# Patient Record
Sex: Female | Born: 1955
Health system: Southern US, Community
[De-identification: ages and names within clinical notes are randomized; demographics above are authoritative.]

## PROBLEM LIST (undated history)

## (undated) DIAGNOSIS — I10 Essential (primary) hypertension: Secondary | ICD-10-CM

## (undated) DIAGNOSIS — E7800 Pure hypercholesterolemia, unspecified: Secondary | ICD-10-CM

## (undated) DIAGNOSIS — Z90710 Acquired absence of both cervix and uterus: Secondary | ICD-10-CM

## (undated) DIAGNOSIS — M797 Fibromyalgia: Secondary | ICD-10-CM

## (undated) HISTORY — DX: Acquired absence of both cervix and uterus: Z90.710

---

## 1981-04-15 HISTORY — PX: GALLBLADDER SURGERY: SHX652

## 1988-04-15 HISTORY — PX: BILATERAL TEMPOROMANDIBULAR JOINT ARTHROPLASTY: SUR77

## 1989-04-15 DIAGNOSIS — IMO0001 Reserved for inherently not codable concepts without codable children: Secondary | ICD-10-CM | POA: Insufficient documentation

## 1989-04-15 HISTORY — PX: PARTIAL HYSTERECTOMY: SHX80

## 1999-09-14 ENCOUNTER — Encounter: Payer: Self-pay | Admitting: Obstetrics and Gynecology

## 1999-09-14 ENCOUNTER — Encounter: Admission: RE | Admit: 1999-09-14 | Discharge: 1999-09-14 | Payer: Self-pay | Admitting: Obstetrics and Gynecology

## 2001-12-01 ENCOUNTER — Encounter: Payer: Self-pay | Admitting: Family Medicine

## 2001-12-01 ENCOUNTER — Encounter: Admission: RE | Admit: 2001-12-01 | Discharge: 2001-12-01 | Payer: Self-pay | Admitting: Family Medicine

## 2003-01-14 ENCOUNTER — Encounter: Admission: RE | Admit: 2003-01-14 | Discharge: 2003-01-14 | Payer: Self-pay | Admitting: Family Medicine

## 2003-01-14 ENCOUNTER — Encounter: Payer: Self-pay | Admitting: Family Medicine

## 2004-08-29 ENCOUNTER — Encounter: Admission: RE | Admit: 2004-08-29 | Discharge: 2004-08-29 | Payer: Self-pay | Admitting: Obstetrics and Gynecology

## 2005-06-03 ENCOUNTER — Emergency Department (HOSPITAL_COMMUNITY): Admission: EM | Admit: 2005-06-03 | Discharge: 2005-06-03 | Payer: Self-pay | Admitting: Emergency Medicine

## 2005-09-03 ENCOUNTER — Encounter: Admission: RE | Admit: 2005-09-03 | Discharge: 2005-09-03 | Payer: Self-pay | Admitting: Internal Medicine

## 2006-10-15 ENCOUNTER — Encounter: Admission: RE | Admit: 2006-10-15 | Discharge: 2006-10-15 | Payer: Self-pay | Admitting: Obstetrics and Gynecology

## 2007-01-29 ENCOUNTER — Encounter (INDEPENDENT_AMBULATORY_CARE_PROVIDER_SITE_OTHER): Payer: Self-pay | Admitting: Nurse Practitioner

## 2007-01-29 LAB — CONVERTED CEMR LAB
LDL Cholesterol: 150 mg/dL
VLDL: 10 mg/dL

## 2007-10-07 ENCOUNTER — Ambulatory Visit: Payer: Self-pay | Admitting: Nurse Practitioner

## 2007-10-07 DIAGNOSIS — E663 Overweight: Secondary | ICD-10-CM | POA: Insufficient documentation

## 2007-10-07 DIAGNOSIS — L259 Unspecified contact dermatitis, unspecified cause: Secondary | ICD-10-CM | POA: Insufficient documentation

## 2007-10-07 DIAGNOSIS — E7800 Pure hypercholesterolemia, unspecified: Secondary | ICD-10-CM | POA: Insufficient documentation

## 2007-10-07 LAB — CONVERTED CEMR LAB
Alkaline Phosphatase: 56 units/L (ref 39–117)
BUN: 8 mg/dL (ref 6–23)
Basophils Absolute: 0 10*3/uL (ref 0.0–0.1)
Blood in Urine, dipstick: NEGATIVE
CO2: 23 meq/L (ref 19–32)
Chloride: 104 meq/L (ref 96–112)
Cholesterol, target level: 200 mg/dL
Eosinophils Absolute: 0 10*3/uL (ref 0.0–0.7)
Glucose, Bld: 72 mg/dL (ref 70–99)
Glucose, Urine, Semiquant: NEGATIVE
HDL goal, serum: 40 mg/dL
Hemoglobin: 11.2 g/dL — ABNORMAL LOW (ref 12.0–15.0)
Ketones, urine, test strip: NEGATIVE
LDL Cholesterol: 131 mg/dL — ABNORMAL HIGH (ref 0–99)
LDL Goal: 160 mg/dL
Lymphocytes Relative: 40 % (ref 12–46)
Lymphs Abs: 1.4 10*3/uL (ref 0.7–4.0)
MCHC: 30.9 g/dL (ref 30.0–36.0)
MCV: 87 fL (ref 78.0–100.0)
Neutro Abs: 1.9 10*3/uL (ref 1.7–7.7)
Neutrophils Relative %: 54 % (ref 43–77)
Nitrite: NEGATIVE
Platelets: 402 10*3/uL — ABNORMAL HIGH (ref 150–400)
Protein, U semiquant: 30
Sodium: 141 meq/L (ref 135–145)
Specific Gravity, Urine: 1.015
TSH: 1.441 microintl units/mL (ref 0.350–5.50)
Total Protein: 7.1 g/dL (ref 6.0–8.3)
Triglycerides: 61 mg/dL (ref <150)
VLDL: 12 mg/dL (ref 0–40)
WBC Urine, dipstick: NEGATIVE

## 2007-10-08 ENCOUNTER — Encounter (INDEPENDENT_AMBULATORY_CARE_PROVIDER_SITE_OTHER): Payer: Self-pay | Admitting: Nurse Practitioner

## 2007-10-09 ENCOUNTER — Encounter (INDEPENDENT_AMBULATORY_CARE_PROVIDER_SITE_OTHER): Payer: Self-pay | Admitting: Nurse Practitioner

## 2007-10-27 ENCOUNTER — Ambulatory Visit: Payer: Self-pay | Admitting: Nurse Practitioner

## 2007-10-27 DIAGNOSIS — I10 Essential (primary) hypertension: Secondary | ICD-10-CM | POA: Insufficient documentation

## 2007-10-27 DIAGNOSIS — N760 Acute vaginitis: Secondary | ICD-10-CM | POA: Insufficient documentation

## 2007-10-28 ENCOUNTER — Ambulatory Visit: Payer: Self-pay | Admitting: *Deleted

## 2007-10-29 ENCOUNTER — Encounter (INDEPENDENT_AMBULATORY_CARE_PROVIDER_SITE_OTHER): Payer: Self-pay | Admitting: Nurse Practitioner

## 2007-10-30 ENCOUNTER — Encounter (INDEPENDENT_AMBULATORY_CARE_PROVIDER_SITE_OTHER): Payer: Self-pay | Admitting: Nurse Practitioner

## 2007-11-03 ENCOUNTER — Ambulatory Visit (HOSPITAL_COMMUNITY): Admission: RE | Admit: 2007-11-03 | Discharge: 2007-11-03 | Payer: Self-pay | Admitting: Family Medicine

## 2007-11-10 ENCOUNTER — Ambulatory Visit: Payer: Self-pay | Admitting: Family Medicine

## 2007-11-27 ENCOUNTER — Telehealth (INDEPENDENT_AMBULATORY_CARE_PROVIDER_SITE_OTHER): Payer: Self-pay | Admitting: Nurse Practitioner

## 2007-12-10 ENCOUNTER — Ambulatory Visit: Payer: Self-pay | Admitting: Nurse Practitioner

## 2007-12-15 ENCOUNTER — Telehealth (INDEPENDENT_AMBULATORY_CARE_PROVIDER_SITE_OTHER): Payer: Self-pay | Admitting: Nurse Practitioner

## 2008-01-22 ENCOUNTER — Ambulatory Visit: Payer: Self-pay | Admitting: Nurse Practitioner

## 2008-01-22 LAB — CONVERTED CEMR LAB
Cholesterol: 195 mg/dL (ref 0–200)
LDL Cholesterol: 94 mg/dL (ref 0–99)
Triglycerides: 59 mg/dL (ref <150)

## 2008-01-25 ENCOUNTER — Encounter (INDEPENDENT_AMBULATORY_CARE_PROVIDER_SITE_OTHER): Payer: Self-pay | Admitting: Nurse Practitioner

## 2008-11-25 ENCOUNTER — Encounter: Admission: RE | Admit: 2008-11-25 | Discharge: 2008-11-25 | Payer: Self-pay | Admitting: Obstetrics and Gynecology

## 2009-11-30 ENCOUNTER — Encounter: Admission: RE | Admit: 2009-11-30 | Discharge: 2009-11-30 | Payer: Self-pay | Admitting: Obstetrics and Gynecology

## 2010-05-13 LAB — CONVERTED CEMR LAB
Bilirubin Urine: NEGATIVE
Chlamydia, DNA Probe: NEGATIVE
GC Probe Amp, Genital: NEGATIVE
Nitrite: NEGATIVE
Specific Gravity, Urine: 1.02
WBC Urine, dipstick: NEGATIVE

## 2010-10-30 ENCOUNTER — Other Ambulatory Visit: Payer: Self-pay | Admitting: Obstetrics and Gynecology

## 2010-10-30 DIAGNOSIS — Z1231 Encounter for screening mammogram for malignant neoplasm of breast: Secondary | ICD-10-CM

## 2010-12-07 ENCOUNTER — Ambulatory Visit
Admission: RE | Admit: 2010-12-07 | Discharge: 2010-12-07 | Disposition: A | Discharge status: Home / Self Care | Payer: 59 | Source: Ambulatory Visit | Attending: Obstetrics and Gynecology | Admitting: Obstetrics and Gynecology

## 2010-12-07 DIAGNOSIS — Z1231 Encounter for screening mammogram for malignant neoplasm of breast: Secondary | ICD-10-CM

## 2011-12-29 ENCOUNTER — Emergency Department (INDEPENDENT_AMBULATORY_CARE_PROVIDER_SITE_OTHER)
Admission: EM | Admit: 2011-12-29 | Discharge: 2011-12-29 | Disposition: A | Discharge status: Home / Self Care | Payer: Self-pay | Source: Home / Self Care | Attending: Emergency Medicine | Admitting: Emergency Medicine

## 2011-12-29 ENCOUNTER — Encounter (HOSPITAL_COMMUNITY): Payer: Self-pay

## 2011-12-29 DIAGNOSIS — R21 Rash and other nonspecific skin eruption: Secondary | ICD-10-CM

## 2011-12-29 HISTORY — DX: Essential (primary) hypertension: I10

## 2011-12-29 HISTORY — DX: Pure hypercholesterolemia, unspecified: E78.00

## 2011-12-29 MED ORDER — TRIAMCINOLONE ACETONIDE 0.1 % EX CREA: TOPICAL_CREAM | Freq: Two times a day (BID) | CUTANEOUS | Status: AC

## 2011-12-29 MED ORDER — TRIAMCINOLONE ACETONIDE 40 MG/ML IJ SUSP
INTRAMUSCULAR | Status: AC
  Filled 2011-12-29: qty 5

## 2011-12-29 MED ORDER — TRIAMCINOLONE ACETONIDE 40 MG/ML IJ SUSP
60.0000 mg | Freq: Once | INTRAMUSCULAR | Status: AC
  Administered 2011-12-29: 60 mg via INTRAMUSCULAR

## 2011-12-29 NOTE — ED Provider Notes (Signed)
History     CSN: [id number]  Arrival date & time 12/29/11  1057   First MD Initiated Contact with Patient 12/29/11 1147      Chief Complaint  Patient presents with  . Rash    (Consider location/radiation/quality/duration/timing/severity/associated sxs/prior treatment) The history is provided by the patient.  This patient complains of a pruritic rash that began on legs and has since spread to upper body.  Location: lower legs, groin, forearms  Onset: 2 wk ago   Course: worsening Self-treated with: cortaid             Improvement with treatment: improved itch  History Itching: yes  Tenderness: no  New medications/antibiotics: no  Pet exposure: no  Recent travel or tropical exposure: no  New soaps, shampoos, detergent, clothing: no Tick/insect exposure: no   Red Flags Feeling ill: no Fever:no Facial/tongue swelling/difficulty breathing:  no  Diabetic or immunocompromised: no  Past Medical History  Diagnosis Date  . Hypertension   . Hypercholesteremia     History reviewed. No pertinent past surgical history.  History reviewed. No pertinent family history.  History  Substance Use Topics  . Smoking status: Never Smoker   . Smokeless tobacco: Not on file  . Alcohol Use: Yes    OB History    Grav Para Term Preterm Abortions TAB SAB Ect Mult Living                  Review of Systems  Constitutional: Negative.   Respiratory: Negative.   Cardiovascular: Negative.   Skin: Positive for rash. Negative for color change, pallor and wound.    Allergies  Aspirin; Ceftriaxone sodium; Codeine; Doxycycline; Penicillins; Simvastatin; Sulfonamide derivatives; and Trimethoprim  Home Medications   Current Outpatient Rx  Name Route Sig Dispense Refill  . LOSARTAN POTASSIUM-HCTZ 50-12.5 MG PO TABS Oral Take 1 tablet by mouth daily.    Marland Kitchen SIMVASTATIN 40 MG PO TABS Oral Take 40 mg by mouth every evening.    Marland Kitchen VALACYCLOVIR HCL 500 MG PO TABS Oral Take 500 mg by mouth 2  (two) times daily.    . TRIAMCINOLONE ACETONIDE 0.1 % EX CREA Topical Apply topically 2 (two) times daily. 45 g 2    BP 124/79  Pulse 104  Temp 98.9 F (37.2 C) (Oral)  Resp 18  SpO2 100%  Physical Exam  Nursing note and vitals reviewed. Constitutional: She is oriented to person, place, and time. Vital signs are normal. She appears well-developed and well-nourished. She is active and cooperative.  HENT:  Head: Normocephalic.  Mouth/Throat: No oropharyngeal exudate.  Eyes: Conjunctivae normal are normal. Pupils are equal, round, and reactive to light. No scleral icterus.  Neck: Trachea normal. Neck supple.  Cardiovascular: Normal rate, regular rhythm and normal heart sounds.   Pulmonary/Chest: Effort normal and breath sounds normal. No respiratory distress.  Lymphadenopathy:    She has no cervical adenopathy.  Neurological: She is alert and oriented to person, place, and time. No cranial nerve deficit or sensory deficit.  Skin: Skin is warm and dry. Rash noted. Rash is papular.       Fine papular rash noted to bilateral lower extremity, diffuse presentation, upper body same but not as diffuse.  Psychiatric: She has a normal mood and affect. Her speech is normal and behavior is normal. Judgment and thought content normal. Cognition and memory are normal.    ED Course  Procedures (including critical care time)  Labs Reviewed - No data to display No results found.  1. Rash and nonspecific skin eruption       MDM Kenalog 60mg  IM administered in office.  Cool showers; avoid heat, sunlight and anything that makes condition worse.  Continue Zyrtec for at least seven days.  Begin Medrol dosepak tomorrow-follow instructions.  RTC if symptoms do not improve or begin to have problems swallowing, breathing or significant change in condition.          Johnsie Kindred, NP 12/29/11 [phone]

## 2011-12-29 NOTE — ED Provider Notes (Signed)
Medical screening examination/treatment/procedure(s) were performed by non-physician practitioner and as supervising physician I was immediately available for consultation/collaboration.  Raynald Blend, MD 12/29/11 1747

## 2011-12-29 NOTE — ED Notes (Signed)
Pt has itchy rash on lower rt leg for several weeks and yesterday it spread up her legs to her groin and on both arms.  No new meds or products.

## 2012-10-22 ENCOUNTER — Other Ambulatory Visit: Payer: Self-pay

## 2012-10-22 DIAGNOSIS — Z1231 Encounter for screening mammogram for malignant neoplasm of breast: Secondary | ICD-10-CM

## 2012-11-17 ENCOUNTER — Ambulatory Visit
Admission: RE | Admit: 2012-11-17 | Discharge: 2012-11-17 | Disposition: A | Discharge status: Home / Self Care | Payer: BC Managed Care – PPO | Source: Ambulatory Visit

## 2012-11-17 DIAGNOSIS — Z1231 Encounter for screening mammogram for malignant neoplasm of breast: Secondary | ICD-10-CM

## 2013-11-23 ENCOUNTER — Encounter (HOSPITAL_COMMUNITY): Payer: Self-pay | Admitting: Emergency Medicine

## 2013-11-23 DIAGNOSIS — I10 Essential (primary) hypertension: Secondary | ICD-10-CM | POA: Diagnosis not present

## 2013-11-23 DIAGNOSIS — R110 Nausea: Secondary | ICD-10-CM | POA: Insufficient documentation

## 2013-11-23 DIAGNOSIS — K5900 Constipation, unspecified: Secondary | ICD-10-CM | POA: Diagnosis present

## 2013-11-23 LAB — COMPREHENSIVE METABOLIC PANEL
ALT: 11 U/L (ref 0–35)
ANION GAP: 14 (ref 5–15)
AST: 16 U/L (ref 0–37)
Albumin: 4.1 g/dL (ref 3.5–5.2)
Alkaline Phosphatase: 75 U/L (ref 39–117)
BUN: 12 mg/dL (ref 6–23)
CALCIUM: 9.2 mg/dL (ref 8.4–10.5)
CO2: 25 mEq/L (ref 19–32)
Chloride: 101 mEq/L (ref 96–112)
Creatinine, Ser: 0.73 mg/dL (ref 0.50–1.10)
GFR calc non Af Amer: >90 mL/min (ref >90)
Glucose, Bld: 120 mg/dL — ABNORMAL HIGH (ref 70–99)
POTASSIUM: 3.8 meq/L (ref 3.7–5.3)
SODIUM: 140 meq/L (ref 137–147)
TOTAL PROTEIN: 7.8 g/dL (ref 6.0–8.3)
Total Bilirubin: 0.3 mg/dL (ref 0.3–1.2)

## 2013-11-23 LAB — CBC WITH DIFFERENTIAL/PLATELET
BASOS ABS: 0 10*3/uL (ref 0.0–0.1)
Basophils Relative: 0 % (ref 0–1)
Eosinophils Absolute: 0.1 10*3/uL (ref 0.0–0.7)
Eosinophils Relative: 1 % (ref 0–5)
HEMATOCRIT: 35 % — AB (ref 36.0–46.0)
Hemoglobin: 11.3 g/dL — ABNORMAL LOW (ref 12.0–15.0)
LYMPHS PCT: 28 % (ref 12–46)
Lymphs Abs: 1.9 10*3/uL (ref 0.7–4.0)
MCH: 26.5 pg (ref 26.0–34.0)
MCHC: 32.3 g/dL (ref 30.0–36.0)
MCV: 82 fL (ref 78.0–100.0)
Monocytes Absolute: 0.4 10*3/uL (ref 0.1–1.0)
Monocytes Relative: 6 % (ref 3–12)
NEUTROS ABS: 4.4 10*3/uL (ref 1.7–7.7)
NEUTROS PCT: 65 % (ref 43–77)
Platelets: 438 10*3/uL — ABNORMAL HIGH (ref 150–400)
RBC: 4.27 MIL/uL (ref 3.87–5.11)
RDW: 14.2 % (ref 11.5–15.5)
WBC: 6.7 10*3/uL (ref 4.0–10.5)

## 2013-11-23 NOTE — ED Notes (Signed)
Pt. reports constipation/hard stools for 2 weeks with occasional nausea , denies fever , no urinary discomfort.

## 2013-11-24 ENCOUNTER — Emergency Department (HOSPITAL_COMMUNITY)
Admission: EM | Admit: 2013-11-24 | Discharge: 2013-11-24 | Discharge status: Against Medical Advice | Payer: BC Managed Care – PPO | Attending: Emergency Medicine | Admitting: Emergency Medicine

## 2013-11-24 HISTORY — DX: Fibromyalgia: M79.7

## 2013-11-24 NOTE — ED Notes (Signed)
See Paper Charting

## 2014-01-07 ENCOUNTER — Other Ambulatory Visit: Payer: Self-pay

## 2014-01-07 DIAGNOSIS — Z1231 Encounter for screening mammogram for malignant neoplasm of breast: Secondary | ICD-10-CM

## 2014-01-20 ENCOUNTER — Ambulatory Visit
Admission: RE | Admit: 2014-01-20 | Discharge: 2014-01-20 | Disposition: A | Discharge status: Home / Self Care | Payer: BC Managed Care – PPO | Source: Ambulatory Visit

## 2014-01-20 DIAGNOSIS — Z1231 Encounter for screening mammogram for malignant neoplasm of breast: Secondary | ICD-10-CM

## 2014-12-22 ENCOUNTER — Other Ambulatory Visit: Payer: Self-pay

## 2014-12-22 DIAGNOSIS — Z1231 Encounter for screening mammogram for malignant neoplasm of breast: Secondary | ICD-10-CM

## 2015-01-24 ENCOUNTER — Ambulatory Visit
Admission: RE | Admit: 2015-01-24 | Discharge: 2015-01-24 | Disposition: A | Discharge status: Home / Self Care | Payer: 59 | Source: Ambulatory Visit

## 2015-01-24 DIAGNOSIS — Z1231 Encounter for screening mammogram for malignant neoplasm of breast: Secondary | ICD-10-CM

## 2015-12-22 ENCOUNTER — Other Ambulatory Visit: Payer: Self-pay | Admitting: Obstetrics and Gynecology

## 2015-12-22 DIAGNOSIS — Z1231 Encounter for screening mammogram for malignant neoplasm of breast: Secondary | ICD-10-CM

## 2016-01-26 ENCOUNTER — Ambulatory Visit
Admission: RE | Admit: 2016-01-26 | Discharge: 2016-01-26 | Disposition: A | Discharge status: Home / Self Care | Payer: 59 | Source: Ambulatory Visit | Attending: Obstetrics and Gynecology | Admitting: Obstetrics and Gynecology

## 2016-01-26 DIAGNOSIS — Z1231 Encounter for screening mammogram for malignant neoplasm of breast: Secondary | ICD-10-CM

## 2016-04-25 DIAGNOSIS — N3020 Other chronic cystitis without hematuria: Secondary | ICD-10-CM | POA: Diagnosis not present

## 2016-04-25 DIAGNOSIS — N200 Calculus of kidney: Secondary | ICD-10-CM | POA: Diagnosis not present

## 2016-06-24 DIAGNOSIS — I10 Essential (primary) hypertension: Secondary | ICD-10-CM | POA: Diagnosis not present

## 2016-06-24 DIAGNOSIS — M797 Fibromyalgia: Secondary | ICD-10-CM | POA: Diagnosis not present

## 2016-07-18 DIAGNOSIS — R7309 Other abnormal glucose: Secondary | ICD-10-CM | POA: Diagnosis not present

## 2016-07-18 DIAGNOSIS — E785 Hyperlipidemia, unspecified: Secondary | ICD-10-CM | POA: Diagnosis not present

## 2016-07-18 DIAGNOSIS — I10 Essential (primary) hypertension: Secondary | ICD-10-CM | POA: Diagnosis not present

## 2016-08-21 DIAGNOSIS — N200 Calculus of kidney: Secondary | ICD-10-CM | POA: Diagnosis not present

## 2016-10-31 DIAGNOSIS — N200 Calculus of kidney: Secondary | ICD-10-CM | POA: Diagnosis not present

## 2016-10-31 DIAGNOSIS — N3020 Other chronic cystitis without hematuria: Secondary | ICD-10-CM | POA: Diagnosis not present

## 2017-01-14 ENCOUNTER — Other Ambulatory Visit: Payer: Self-pay | Admitting: Obstetrics and Gynecology

## 2017-01-14 DIAGNOSIS — D2371 Other benign neoplasm of skin of right lower limb, including hip: Secondary | ICD-10-CM | POA: Diagnosis not present

## 2017-01-14 DIAGNOSIS — L820 Inflamed seborrheic keratosis: Secondary | ICD-10-CM | POA: Diagnosis not present

## 2017-01-14 DIAGNOSIS — Z1231 Encounter for screening mammogram for malignant neoplasm of breast: Secondary | ICD-10-CM

## 2017-01-14 DIAGNOSIS — L918 Other hypertrophic disorders of the skin: Secondary | ICD-10-CM | POA: Diagnosis not present

## 2017-01-28 ENCOUNTER — Ambulatory Visit
Admission: RE | Admit: 2017-01-28 | Discharge: 2017-01-28 | Disposition: A | Discharge status: Home / Self Care | Payer: 59 | Source: Ambulatory Visit | Attending: Obstetrics and Gynecology | Admitting: Obstetrics and Gynecology

## 2017-01-28 DIAGNOSIS — Z1231 Encounter for screening mammogram for malignant neoplasm of breast: Secondary | ICD-10-CM

## 2017-03-11 DIAGNOSIS — M797 Fibromyalgia: Secondary | ICD-10-CM | POA: Diagnosis not present

## 2017-03-11 DIAGNOSIS — Z23 Encounter for immunization: Secondary | ICD-10-CM | POA: Diagnosis not present

## 2017-03-11 DIAGNOSIS — I10 Essential (primary) hypertension: Secondary | ICD-10-CM | POA: Diagnosis not present

## 2017-03-11 DIAGNOSIS — Z0000 Encounter for general adult medical examination without abnormal findings: Secondary | ICD-10-CM | POA: Diagnosis not present

## 2017-04-14 DIAGNOSIS — M797 Fibromyalgia: Secondary | ICD-10-CM | POA: Diagnosis not present

## 2017-04-14 DIAGNOSIS — I10 Essential (primary) hypertension: Secondary | ICD-10-CM | POA: Diagnosis not present

## 2017-04-23 DIAGNOSIS — N3020 Other chronic cystitis without hematuria: Secondary | ICD-10-CM | POA: Diagnosis not present

## 2017-04-23 DIAGNOSIS — Z87442 Personal history of urinary calculi: Secondary | ICD-10-CM | POA: Diagnosis not present

## 2017-05-17 DIAGNOSIS — R05 Cough: Secondary | ICD-10-CM | POA: Diagnosis not present

## 2017-05-17 DIAGNOSIS — R509 Fever, unspecified: Secondary | ICD-10-CM | POA: Diagnosis not present

## 2017-05-17 DIAGNOSIS — J111 Influenza due to unidentified influenza virus with other respiratory manifestations: Secondary | ICD-10-CM | POA: Diagnosis not present

## 2017-09-02 DIAGNOSIS — R05 Cough: Secondary | ICD-10-CM | POA: Diagnosis not present

## 2017-09-02 DIAGNOSIS — J302 Other seasonal allergic rhinitis: Secondary | ICD-10-CM | POA: Diagnosis not present

## 2017-09-02 DIAGNOSIS — R0982 Postnasal drip: Secondary | ICD-10-CM | POA: Diagnosis not present

## 2017-09-10 DIAGNOSIS — R7309 Other abnormal glucose: Secondary | ICD-10-CM | POA: Diagnosis not present

## 2017-09-10 DIAGNOSIS — I10 Essential (primary) hypertension: Secondary | ICD-10-CM | POA: Diagnosis not present

## 2017-09-10 DIAGNOSIS — J309 Allergic rhinitis, unspecified: Secondary | ICD-10-CM | POA: Diagnosis not present

## 2017-09-10 DIAGNOSIS — E785 Hyperlipidemia, unspecified: Secondary | ICD-10-CM | POA: Diagnosis not present

## 2017-09-10 DIAGNOSIS — Z79899 Other long term (current) drug therapy: Secondary | ICD-10-CM | POA: Diagnosis not present

## 2018-01-06 ENCOUNTER — Other Ambulatory Visit: Payer: Self-pay | Admitting: Internal Medicine

## 2018-01-06 DIAGNOSIS — Z1231 Encounter for screening mammogram for malignant neoplasm of breast: Secondary | ICD-10-CM

## 2018-01-29 ENCOUNTER — Telehealth: Payer: Self-pay

## 2018-01-29 ENCOUNTER — Other Ambulatory Visit: Payer: Self-pay | Admitting: Internal Medicine

## 2018-01-29 NOTE — Telephone Encounter (Signed)
Message sent to Dr. Baird Cancer that the pt is requesting a refill on her Ibuprofen 800 mg tablets

## 2018-02-02 ENCOUNTER — Other Ambulatory Visit: Payer: Self-pay | Admitting: Internal Medicine

## 2018-02-02 NOTE — Telephone Encounter (Signed)
Ibuprofen 800mg refill.

## 2018-02-06 ENCOUNTER — Ambulatory Visit: Payer: 59

## 2018-02-06 ENCOUNTER — Ambulatory Visit
Admission: RE | Admit: 2018-02-06 | Discharge: 2018-02-06 | Disposition: A | Discharge status: Home / Self Care | Payer: 59 | Source: Ambulatory Visit | Attending: Internal Medicine | Admitting: Internal Medicine

## 2018-02-06 DIAGNOSIS — Z1231 Encounter for screening mammogram for malignant neoplasm of breast: Secondary | ICD-10-CM

## 2018-02-09 ENCOUNTER — Ambulatory Visit: Payer: 59

## 2018-02-17 ENCOUNTER — Other Ambulatory Visit: Payer: Self-pay | Admitting: Internal Medicine

## 2018-02-17 ENCOUNTER — Telehealth: Payer: Self-pay

## 2018-02-17 NOTE — Telephone Encounter (Signed)
Called pt and advised her to call her pharmacy and make sure the losartan is currently taking does not have a recall on it and if it does to give [place] a call back so we can switch her to something else. YRL,RMA

## 2018-03-01 ENCOUNTER — Other Ambulatory Visit: Payer: Self-pay | Admitting: Nurse Practitioner

## 2018-03-03 DIAGNOSIS — Z6834 Body mass index (BMI) 34.0-34.9, adult: Secondary | ICD-10-CM | POA: Diagnosis not present

## 2018-03-03 DIAGNOSIS — Z01419 Encounter for gynecological examination (general) (routine) without abnormal findings: Secondary | ICD-10-CM | POA: Diagnosis not present

## 2018-03-09 ENCOUNTER — Other Ambulatory Visit: Payer: Self-pay | Admitting: Internal Medicine

## 2018-03-15 ENCOUNTER — Other Ambulatory Visit: Payer: Self-pay | Admitting: Internal Medicine

## 2018-03-23 ENCOUNTER — Ambulatory Visit: Payer: 59 | Admitting: Nurse Practitioner

## 2018-03-23 ENCOUNTER — Encounter: Payer: Self-pay | Admitting: Nurse Practitioner

## 2018-03-23 VITALS — BP 132/80 | HR 72 | Temp 98.0°F | Ht 62.25 in | Wt 191.6 lb

## 2018-03-23 DIAGNOSIS — Z0000 Encounter for general adult medical examination without abnormal findings: Secondary | ICD-10-CM

## 2018-03-23 DIAGNOSIS — E559 Vitamin D deficiency, unspecified: Secondary | ICD-10-CM | POA: Diagnosis not present

## 2018-03-23 DIAGNOSIS — I10 Essential (primary) hypertension: Secondary | ICD-10-CM

## 2018-03-23 DIAGNOSIS — M797 Fibromyalgia: Secondary | ICD-10-CM

## 2018-03-23 DIAGNOSIS — E785 Hyperlipidemia, unspecified: Secondary | ICD-10-CM | POA: Diagnosis not present

## 2018-03-23 DIAGNOSIS — Z139 Encounter for screening, unspecified: Secondary | ICD-10-CM

## 2018-03-23 DIAGNOSIS — R7303 Prediabetes: Secondary | ICD-10-CM

## 2018-03-23 DIAGNOSIS — Z1211 Encounter for screening for malignant neoplasm of colon: Secondary | ICD-10-CM

## 2018-03-23 LAB — POCT URINALYSIS DIPSTICK
Bilirubin, UA: NEGATIVE
Clarity, UA: NEGATIVE
Color, UA: NEGATIVE
Glucose, UA: NEGATIVE
KETONES UA: NEGATIVE
LEUKOCYTES UA: NEGATIVE
NITRITE UA: NEGATIVE
PH UA: 6.5 (ref 5.0–8.0)
PROTEIN UA: NEGATIVE
RBC UA: NEGATIVE
SPEC GRAV UA: 1.02 (ref 1.010–1.025)
UROBILINOGEN UA: 0.2 U/dL

## 2018-03-23 LAB — POCT UA - MICROALBUMIN
Albumin/Creatinine Ratio, Urine, POC: 30
Creatinine, POC: 200 mg/dL
Microalbumin Ur, POC: 30 mg/L

## 2018-03-23 NOTE — Patient Instructions (Addendum)

## 2018-03-23 NOTE — Progress Notes (Signed)
Subjective:     Patient ID: Margaret Hill , female    DOB: [birth year] , 62 y.o.   MRN: [id number]   Chief Complaint  Patient presents with  . Annual Exam   The patient states she uses post menopausal status for birth control. Last LMP was No LMP recorded. Patient is postmenopausal.. Negative for Dysmenorrhea and Negative for Menorrhagia Mammogram last done 02/06/2018 Negative for: breast discharge, breast lump(s), breast pain and breast self exam.  Pertinent negatives include abnormal bleeding (hematology), anxiety, decreased libido, depression, difficulty falling sleep, dyspareunia, history of infertility, nocturia, sexual dysfunction, sleep disturbances, urinary incontinence, urinary urgency, vaginal discharge and vaginal itching. Diet regular, limits sweets. The patient states her exercise level is  intermittently exercises.    .  The patient's tobacco use is:   Social History   Tobacco Use  Smoking Status Never Smoker  Smokeless Tobacco Never Used  . She has been exposed to passive smoke. The patient's alcohol use is:   Social History   Substance and Sexual Activity  Alcohol Use Yes  . Additional information: Last pap 02/2018 normal , next one scheduled for 2-3 years   HPI  Here for HM  Obesity - she is exercising 3-4 days per week  Hypertension  This is a chronic problem. The current episode started more than 1 year ago. The problem is unchanged. The problem is controlled. Pertinent negatives include no anxiety or palpitations. There are no associated agents to hypertension. Risk factors for coronary artery disease include sedentary lifestyle. The current treatment provides no improvement. There are no compliance problems.  There is no history of kidney disease. There is no history of chronic renal disease.     Past Medical History:  Diagnosis Date  . Fibromyalgia   . Hypercholesteremia   . Hypertension      No family history on file.   Current Outpatient  Medications:  .  Famotidine 20 MG CHEW, Chew 10 mg by mouth daily., Disp: , Rfl:  .  fluocinonide ointment (LIDEX) 2.77 %, Apply 1 application topically 2 (two) times daily., Disp: , Rfl:  .  LIVALO 4 MG TABS, TAKE 1 TABLET BY MOUTH EVERY DAY, Disp: 30 tablet, Rfl: 0 .  losartan-hydrochlorothiazide (HYZAAR) 50-12.5 MG tablet, TAKE 1 TABLET BY MOUTH EVERY DAY, Disp: 30 tablet, Rfl: 0 .  valACYclovir (VALTREX) 500 MG tablet, Take 500 mg by mouth 2 (two) times daily., Disp: , Rfl:    Allergies  Allergen Reactions  . Aspirin     REACTION: stomach pain  . Ceftriaxone Sodium   . Codeine     REACTION: shortness of breath  . Doxycycline     REACTION: hives/skin rash  . Penicillins     REACTION: hives/skin rash  . Simvastatin     REACTION: muscular pain  . Sulfonamide Derivatives     REACTION: hives/skin rash  . Trimethoprim     REACTION: Hives/skin rash     Review of Systems  Constitutional: Negative.   HENT: Negative.   Eyes: Negative.   Respiratory: Negative.   Cardiovascular: Negative.  Negative for palpitations.  Gastrointestinal: Negative.   Endocrine: Negative.   Genitourinary: Negative.   Musculoskeletal: Negative.   Skin: Negative.   Allergic/Immunologic: Negative.   Neurological: Negative.   Hematological: Negative.   Psychiatric/Behavioral: Negative.      Today's Vitals   03/23/18 1116  BP: 132/80  Pulse: 72  Temp: 98 F (36.7 C)  TempSrc: Oral  SpO2: 98%  Weight: 191  lb 9.6 oz (86.9 kg)  Height: 5' 2.25" (1.581 m)   Body mass index is 34.76 kg/m.   Objective:  Physical Exam  Constitutional: She is oriented to person, place, and time. Vital signs are normal. She appears well-developed and well-nourished.  HENT:  Head: Normocephalic and atraumatic.  Right Ear: Hearing, tympanic membrane, external ear and ear canal normal.  Left Ear: Hearing, tympanic membrane, external ear and ear canal normal.  Nose: Nose normal.  Mouth/Throat: Oropharynx is clear  and moist.  Eyes: Pupils are equal, round, and reactive to light. Conjunctivae, EOM and lids are normal.  Fundoscopic exam:      The right eye shows no papilledema.       The left eye shows no papilledema.  Neck: Normal range of motion and full passive range of motion without pain. Neck supple. Carotid bruit is not present. No thyroid mass present.  Cardiovascular: Normal rate, regular rhythm, normal heart sounds, intact distal pulses and normal pulses.  No murmur heard. Pulmonary/Chest: Effort normal and breath sounds normal.  Abdominal: Soft. Bowel sounds are normal. There is tenderness (slightly tender to left abdomen).  Musculoskeletal: Normal range of motion.  Neurological: She is alert and oriented to person, place, and time. She has normal strength. No cranial nerve deficit or sensory deficit.  Skin: Skin is warm and dry. Capillary refill takes less than 2 seconds.  Psychiatric: She has a normal mood and affect. Her behavior is normal. Judgment and thought content normal.        Assessment And Plan:     1. Health maintenance examination . Behavior modifications discussed and diet history reviewed.   . Pt will continue to exercise regularly and modify diet with low GI, plant based foods and decrease intake of processed foods.  . Recommend intake of daily multivitamin, Vitamin D, and calcium.  . Recommend mammogram (done) and colonoscopy for preventive screenings, as well as recommend immunizations that include influenza, and Shingles  2. HYPERTENSION, BENIGN ESSENTIAL  Chronic, controlled  Continue with current medications - POCT Urinalysis Dipstick (81002) - POCT UA - Microalbumin - EKG 12-Lead  3. Vitamin D deficiency  Will check vitamin d level  Encouraged to continue with vitamin d supplement - Vitamin D (25 hydroxy)  4. Hyperlipidemia, unspecified hyperlipidemia type  Chronic, controlled  Continue with current medications - CMP14 + Anion Gap - Lipid  panel  5. Prediabetes  Chronic, controlled  No current medications - CMP14 + Anion Gap - CBC - Hemoglobin A1c  6. Encounter for screening colonoscopy  According to USPTF Colorectal cancer Screening guidelines. Colonoscopy is recommended every 10 years, starting at age 62years.  Will refer to GI for colon cancer screening. - Ambulatory referral to Gastroenterology  7. Fibromyalgia  Chronic  Currently controlled       [name], FNP  Patient ID: Margaret Hill, female   DOB: April 08, 1956, 62 y.o.   MRN: [id number]

## 2018-03-24 LAB — CMP14 + ANION GAP
ALT: 12 IU/L (ref 0–32)
AST: 18 IU/L (ref 0–40)
Albumin/Globulin Ratio: 1.7 (ref 1.2–2.2)
Albumin: 4.9 g/dL — ABNORMAL HIGH (ref 3.6–4.8)
Alkaline Phosphatase: 68 IU/L (ref 39–117)
Anion Gap: 26 mmol/L — ABNORMAL HIGH (ref 10.0–18.0)
BUN/Creatinine Ratio: 14 (ref 12–28)
BUN: 11 mg/dL (ref 8–27)
Bilirubin Total: 0.6 mg/dL (ref 0.0–1.2)
CHLORIDE: 96 mmol/L (ref 96–106)
CO2: 20 mmol/L (ref 20–29)
Calcium: 9.3 mg/dL (ref 8.7–10.3)
Creatinine, Ser: 0.76 mg/dL (ref 0.57–1.00)
GFR calc Af Amer: 97 mL/min/{1.73_m2} (ref >59)
GFR calc non Af Amer: 84 mL/min/{1.73_m2} (ref >59)
Globulin, Total: 2.9 g/dL (ref 1.5–4.5)
Glucose: 74 mg/dL (ref 65–99)
POTASSIUM: 4.2 mmol/L (ref 3.5–5.2)
Sodium: 142 mmol/L (ref 134–144)
Total Protein: 7.8 g/dL (ref 6.0–8.5)

## 2018-03-24 LAB — CBC
Hematocrit: 35.9 % (ref 34.0–46.6)
Hemoglobin: 11.4 g/dL (ref 11.1–15.9)
MCH: 26.3 pg — ABNORMAL LOW (ref 26.6–33.0)
MCHC: 31.8 g/dL (ref 31.5–35.7)
MCV: 83 fL (ref 79–97)
Platelets: 476 10*3/uL — ABNORMAL HIGH (ref 150–450)
RBC: 4.34 x10E6/uL (ref 3.77–5.28)
RDW: 13.1 % (ref 12.3–15.4)
WBC: 5.6 10*3/uL (ref 3.4–10.8)

## 2018-03-24 LAB — LIPID PANEL
CHOLESTEROL TOTAL: 178 mg/dL (ref 100–199)
Chol/HDL Ratio: 2.4 ratio (ref 0.0–4.4)
HDL: 73 mg/dL (ref >39)
LDL Calculated: 95 mg/dL (ref 0–99)
TRIGLYCERIDES: 52 mg/dL (ref 0–149)
VLDL Cholesterol Cal: 10 mg/dL (ref 5–40)

## 2018-03-24 LAB — HEMOGLOBIN A1C
Est. average glucose Bld gHb Est-mCnc: 134 mg/dL
Hgb A1c MFr Bld: 6.3 % — ABNORMAL HIGH (ref 4.8–5.6)

## 2018-03-24 LAB — VITAMIN D 25 HYDROXY (VIT D DEFICIENCY, FRACTURES): Vit D, 25-Hydroxy: 25 ng/mL — ABNORMAL LOW (ref 30.0–100.0)

## 2018-03-24 LAB — HEPATITIS C ANTIBODY: Hep C Virus Ab: <0.1 s/co ratio (ref 0.0–0.9)

## 2018-04-20 ENCOUNTER — Other Ambulatory Visit: Payer: Self-pay | Admitting: Internal Medicine

## 2018-04-28 ENCOUNTER — Encounter: Payer: Self-pay | Admitting: Internal Medicine

## 2018-04-28 ENCOUNTER — Ambulatory Visit: Payer: 59 | Admitting: Internal Medicine

## 2018-04-28 VITALS — BP 120/78 | HR 80 | Temp 98.0°F | Ht 62.6 in | Wt 192.2 lb

## 2018-04-28 DIAGNOSIS — M797 Fibromyalgia: Secondary | ICD-10-CM

## 2018-04-28 MED ORDER — KETOROLAC TROMETHAMINE 60 MG/2ML IM SOLN
60.0000 mg | Freq: Once | INTRAMUSCULAR | Status: AC
  Administered 2018-04-28: 60 mg via INTRAMUSCULAR

## 2018-04-28 NOTE — Patient Instructions (Signed)
TRY MELATONIN FOR SLEEP 3-10 MG QT BED TIME.

## 2018-04-28 NOTE — Progress Notes (Signed)
Subjective:     Patient ID: Margaret Hill , female    DOB: 15-Apr-1956 , 63 y.o.   MRN: [id number]   Chief Complaint  Patient presents with  . fibro f/u    patient states her whole body is in pain she stated it is due to stress    HPI  She is here due to fibro flair 05-01-18 and she tripped over a suit case and since then her pain has been worse. She denies injuring herself. She has not been exercising since. She does do her stretches qd. Her sister died 05/01/18 and when she gets this way Toradol helps her.    Past Medical History:  Diagnosis Date  . Fibromyalgia   . Hypercholesteremia   . Hypertension      No family history on file.   Current Outpatient Medications:  .  Famotidine 20 MG CHEW, Chew 10 mg by mouth daily., Disp: , Rfl:  .  fluocinonide ointment (LIDEX) 0.86 %, Apply 1 application topically 2 (two) times daily., Disp: , Rfl:  .  LIVALO 4 MG TABS, TAKE 1 TABLET BY MOUTH EVERY DAY, Disp: 30 tablet, Rfl: 0 .  losartan-hydrochlorothiazide (HYZAAR) 50-12.5 MG tablet, TAKE 1 TABLET BY MOUTH EVERY DAY, Disp: 30 tablet, Rfl: 0 .  valACYclovir (VALTREX) 500 MG tablet, Take 500 mg by mouth 2 (two) times daily., Disp: , Rfl:    Allergies  Allergen Reactions  . Aspirin     REACTION: stomach pain  . Ceftriaxone Sodium   . Codeine     REACTION: shortness of breath  . Doxycycline     REACTION: hives/skin rash  . Penicillins     REACTION: hives/skin rash  . Simvastatin     REACTION: muscular pain  . Sulfonamide Derivatives     REACTION: hives/skin rash  . Trimethoprim     REACTION: Hives/skin rash     Review of Systems  Constitutional: Negative for appetite change.  Psychiatric/Behavioral: Positive for sleep disturbance.       Only gets 3 h per night.      Today's Vitals   04/28/18 1430  BP: 120/78  Pulse: 80  Temp: 98 F (36.7 C)  TempSrc: Oral  SpO2: 96%  Weight: 192 lb 3.2 oz (87.2 kg)  Height: 5' 2.6" (1.59 m)  PainSc: 10-Worst pain ever   Body  mass index is 34.48 kg/m.   Objective:  Physical Exam Vitals signs and nursing note reviewed.  Constitutional:      General: She is not in acute distress.    Appearance: She is obese. She is not toxic-appearing.  HENT:     Head: Normocephalic.     Right Ear: External ear normal.     Left Ear: External ear normal.     Nose: Nose normal.  Eyes:     General: No scleral icterus.    Conjunctiva/sclera: Conjunctivae normal.  Neck:     Musculoskeletal: Neck supple.  Cardiovascular:     Rate and Rhythm: Normal rate and regular rhythm.     Heart sounds: No murmur.  Pulmonary:     Effort: Pulmonary effort is normal.     Breath sounds: Normal breath sounds.  Musculoskeletal: Normal range of motion.     Comments: Has 6 point tender areas most on R side of body  Skin:    General: Skin is warm and dry.  Neurological:     Mental Status: She is alert and oriented to person, place, and time.  Psychiatric:  Behavior: Behavior normal.        Thought Content: Thought content normal.        Judgment: Judgment normal.     Comments: MOOD IS SAD     Assessment And Plan:   1. Fibromyalgia- acute flair.  - ketorolac (TORADOL) injection 60 mg  Fu prn.     Marshella Tello RODRIGUEZ-SOUTHWORTH, PA-C

## 2018-05-17 ENCOUNTER — Other Ambulatory Visit: Payer: Self-pay | Admitting: Internal Medicine

## 2018-06-23 ENCOUNTER — Other Ambulatory Visit: Payer: Self-pay | Admitting: Nurse Practitioner

## 2018-07-21 ENCOUNTER — Other Ambulatory Visit: Payer: Self-pay | Admitting: Nurse Practitioner

## 2018-09-21 ENCOUNTER — Other Ambulatory Visit: Payer: Self-pay

## 2018-09-21 ENCOUNTER — Ambulatory Visit: Payer: 59 | Admitting: Nurse Practitioner

## 2018-09-21 ENCOUNTER — Encounter: Payer: Self-pay | Admitting: Nurse Practitioner

## 2018-09-21 VITALS — BP 124/84 | HR 79 | Temp 98.1°F | Wt 196.4 lb

## 2018-09-21 DIAGNOSIS — R7303 Prediabetes: Secondary | ICD-10-CM | POA: Diagnosis not present

## 2018-09-21 DIAGNOSIS — E785 Hyperlipidemia, unspecified: Secondary | ICD-10-CM

## 2018-09-21 DIAGNOSIS — I10 Essential (primary) hypertension: Secondary | ICD-10-CM | POA: Diagnosis not present

## 2018-09-21 DIAGNOSIS — R110 Nausea: Secondary | ICD-10-CM | POA: Insufficient documentation

## 2018-09-21 DIAGNOSIS — R519 Headache, unspecified: Secondary | ICD-10-CM | POA: Insufficient documentation

## 2018-09-21 DIAGNOSIS — M797 Fibromyalgia: Secondary | ICD-10-CM

## 2018-09-21 DIAGNOSIS — E559 Vitamin D deficiency, unspecified: Secondary | ICD-10-CM

## 2018-09-21 DIAGNOSIS — M7660 Achilles tendinitis, unspecified leg: Secondary | ICD-10-CM | POA: Insufficient documentation

## 2018-09-21 DIAGNOSIS — R51 Headache: Secondary | ICD-10-CM

## 2018-09-21 MED ORDER — PITAVASTATIN CALCIUM 4 MG PO TABS: 1.0000 | ORAL_TABLET | Freq: Every day | ORAL | 1 refills | Status: DC

## 2018-09-21 MED ORDER — LOSARTAN POTASSIUM-HCTZ 50-12.5 MG PO TABS: 1.0000 | ORAL_TABLET | Freq: Every day | ORAL | 1 refills | Status: DC

## 2018-09-21 MED ORDER — PREDNISONE 10 MG (21) PO TBPK: ORAL_TABLET | ORAL | 0 refills | Status: DC

## 2018-09-21 NOTE — Progress Notes (Signed)
Subjective:     Patient ID: Margaret Hill , female    DOB: [birth year] , 63 y.o.   MRN: [id number]   Chief Complaint  Patient presents with  . Hypertension    HPI  In the last 2 months she has dealt with multiple deaths.    Hypertension  This is a chronic problem. The current episode started more than 1 year ago. The problem is unchanged. The problem is controlled. Associated symptoms include headaches. Pertinent negatives include no anxiety or palpitations. There are no associated agents to hypertension. Risk factors for coronary artery disease include sedentary lifestyle. Past treatments include diuretics and ACE inhibitors. The current treatment provides no improvement. There are no compliance problems.  There is no history of kidney disease. There is no history of chronic renal disease.  Ankle Pain   Injury mechanism: injured as a child. The pain is present in the right ankle. The quality of the pain is described as aching. The pain is at a severity of 5/10. The patient is experiencing no pain. The pain has been intermittent since onset. Pertinent negatives include no inability to bear weight or muscle weakness. She has tried ice and NSAIDs for the symptoms. The treatment provided no relief.  Headache   This is a new problem. The current episode started in the past 7 days. The problem occurs daily. The problem has been gradually worsening. The pain does not radiate. The quality of the pain is described as aching. Pertinent negatives include no sinus pressure, sore throat or visual change. She has tried acetaminophen for the symptoms. Her past medical history is significant for hypertension and migraine headaches (as a teenager).     Past Medical History:  Diagnosis Date  . Fibromyalgia   . Hypercholesteremia   . Hypertension      No family history on file.   Current Outpatient Medications:  .  Famotidine 20 MG CHEW, Chew 10 mg by mouth daily., Disp: , Rfl:  .  fluocinonide  ointment (LIDEX) 3.79 %, Apply 1 application topically 2 (two) times daily., Disp: , Rfl:  .  LIVALO 4 MG TABS, TAKE 1 TABLET BY MOUTH EVERY DAY, Disp: 30 tablet, Rfl: 0 .  losartan-hydrochlorothiazide (HYZAAR) 50-12.5 MG tablet, TAKE 1 TABLET BY MOUTH EVERY DAY, Disp: 90 tablet, Rfl: 0 .  valACYclovir (VALTREX) 500 MG tablet, Take 500 mg by mouth 2 (two) times daily., Disp: , Rfl:    Allergies  Allergen Reactions  . Aspirin     REACTION: stomach pain  . Ceftriaxone Sodium   . Codeine     REACTION: shortness of breath  . Doxycycline     REACTION: hives/skin rash  . Penicillins     REACTION: hives/skin rash  . Simvastatin     REACTION: muscular pain  . Sulfonamide Derivatives     REACTION: hives/skin rash  . Trimethoprim     REACTION: Hives/skin rash     Review of Systems  Constitutional: Negative.   HENT: Negative.  Negative for sinus pressure and sore throat.   Eyes: Negative.   Respiratory: Negative.   Cardiovascular: Negative.  Negative for palpitations.  Gastrointestinal: Negative.   Endocrine: Negative.   Genitourinary: Negative for frequency.  Musculoskeletal: Negative.   Skin: Negative.   Allergic/Immunologic: Negative.   Neurological: Positive for headaches.  Hematological: Negative.   Psychiatric/Behavioral: Negative.      Today's Vitals   09/21/18 0846  BP: 124/84  Pulse: 79  Temp: 98.1 F (36.7 C)  TempSrc:  Oral  Weight: 196 lb 6.4 oz (89.1 kg)  PainSc: 8    Body mass index is 35.24 kg/m.   Objective:  Physical Exam Constitutional:      Appearance: Normal appearance. She is well-developed. She is obese.  HENT:     Head:     Comments: Left temporal area with tenderness, negative for ropelike vessels.    Right Ear: Hearing normal.     Left Ear: Hearing normal.  Eyes:     General: Lids are normal.     Funduscopic exam:    Right eye: No papilledema.        Left eye: No papilledema.  Neck:     Musculoskeletal: Full passive range of motion  without pain, normal range of motion and neck supple.     Thyroid: No thyroid mass.     Vascular: No carotid bruit.  Cardiovascular:     Rate and Rhythm: Normal rate and regular rhythm.     Pulses: Normal pulses.     Heart sounds: Normal heart sounds. No murmur.  Pulmonary:     Effort: Pulmonary effort is normal.     Breath sounds: Normal breath sounds.  Abdominal:     Tenderness: There is no abdominal tenderness.  Musculoskeletal:        General: Tenderness (right medial ankle and achilles) present. No swelling or deformity.     Right lower leg: No edema.     Left lower leg: No edema.  Skin:    General: Skin is warm and dry.     Capillary Refill: Capillary refill takes less than 2 seconds.  Neurological:     General: No focal deficit present.     Mental Status: She is alert and oriented to person, place, and time.     Cranial Nerves: No cranial nerve deficit.     Sensory: No sensory deficit.  Psychiatric:        Mood and Affect: Mood normal.        Behavior: Behavior normal.        Thought Content: Thought content normal.        Judgment: Judgment normal.         Assessment And Plan:   1. HYPERTENSION, BENIGN ESSENTIAL . B/P is controlled.  . CMP ordered to check renal function.  . The importance of regular exercise and dietary modification was stressed to the patient.  . Stressed importance of losing ten percent of her body weight to help with B/P control.  - CMP14 + Anion Gap  2. Vitamin D deficiency  Will check vitamin D level and supplement as needed.     Also encouraged to spend 15 minutes in the sun daily.   3. Hyperlipidemia, unspecified hyperlipidemia type  Chronic, controlled  Continue with current medications  4. Prediabetes  Chronic, controlled  Encouraged to increase her fiber intake  Encouraged to limit intake of sugary foods and drinks  Encouraged to increase physical activity to 150 minutes per week as tolerated in small increments -  Hemoglobin A1c  5. Fibromyalgia  Chronic, slight flair she feels pain to the right side of her abdomen  6. Achilles tendon pain  Tenderness to medial aspect of achilles tendon.    Will treat with prednisone  Will also send for achilles xray to evaluate for structural damage. - predniSONE (STERAPRED UNI-PAK 21 TAB) 10 MG (21) TBPK tablet; Take as directed  Dispense: 21 tablet; Refill: 0 - DG Ankle Complete Right; Future  7. Acute  nonintractable headache, unspecified headache type  Tenderness to left temporal area.  She is over the age of 63 so will check for elevated ESR, she will be on prednisone already for her tendon for possible tendonitis - Sed Rate (ESR)  8. Nausea  May be related to possible headache  If symptoms worsen she is to go to ER or return to call to office - Sed Rate (ESR)     [name], FNP    THE PATIENT IS ENCOURAGED TO PRACTICE SOCIAL DISTANCING DUE TO THE COVID-19 PANDEMIC.

## 2018-09-22 ENCOUNTER — Ambulatory Visit
Admission: RE | Admit: 2018-09-22 | Discharge: 2018-09-22 | Disposition: A | Discharge status: Home / Self Care | Payer: 59 | Source: Ambulatory Visit | Attending: Nurse Practitioner | Admitting: Nurse Practitioner

## 2018-09-22 DIAGNOSIS — M7660 Achilles tendinitis, unspecified leg: Secondary | ICD-10-CM

## 2018-09-22 LAB — CMP14 + ANION GAP
ALT: 13 IU/L (ref 0–32)
AST: 17 IU/L (ref 0–40)
Albumin/Globulin Ratio: 2 (ref 1.2–2.2)
Albumin: 4.8 g/dL (ref 3.8–4.8)
Alkaline Phosphatase: 62 IU/L (ref 39–117)
Anion Gap: 14 mmol/L (ref 10.0–18.0)
BUN/Creatinine Ratio: 21 (ref 12–28)
BUN: 16 mg/dL (ref 8–27)
Bilirubin Total: 0.5 mg/dL (ref 0.0–1.2)
CO2: 24 mmol/L (ref 20–29)
Calcium: 9.4 mg/dL (ref 8.7–10.3)
Chloride: 102 mmol/L (ref 96–106)
Creatinine, Ser: 0.75 mg/dL (ref 0.57–1.00)
GFR calc Af Amer: 99 mL/min/{1.73_m2} (ref >59)
GFR calc non Af Amer: 86 mL/min/{1.73_m2} (ref >59)
Globulin, Total: 2.4 g/dL (ref 1.5–4.5)
Glucose: 91 mg/dL (ref 65–99)
Potassium: 4.3 mmol/L (ref 3.5–5.2)
Sodium: 140 mmol/L (ref 134–144)
Total Protein: 7.2 g/dL (ref 6.0–8.5)

## 2018-09-22 LAB — HEMOGLOBIN A1C
Est. average glucose Bld gHb Est-mCnc: 137 mg/dL
Hgb A1c MFr Bld: 6.4 % — ABNORMAL HIGH (ref 4.8–5.6)

## 2018-09-22 LAB — SEDIMENTATION RATE: Sed Rate: 33 mm/hr (ref 0–40)

## 2018-09-22 NOTE — Progress Notes (Signed)
Okay noted

## 2018-11-13 ENCOUNTER — Ambulatory Visit (HOSPITAL_COMMUNITY)
Admission: EM | Admit: 2018-11-13 | Discharge: 2018-11-13 | Disposition: A | Discharge status: Home / Self Care | Payer: 59 | Attending: Internal Medicine | Admitting: Internal Medicine

## 2018-11-13 ENCOUNTER — Other Ambulatory Visit: Payer: Self-pay

## 2018-11-13 ENCOUNTER — Encounter (HOSPITAL_COMMUNITY): Payer: Self-pay

## 2018-11-13 DIAGNOSIS — Z20828 Contact with and (suspected) exposure to other viral communicable diseases: Secondary | ICD-10-CM | POA: Diagnosis present

## 2018-11-13 DIAGNOSIS — Z20822 Contact with and (suspected) exposure to covid-19: Secondary | ICD-10-CM

## 2018-11-13 NOTE — Discharge Instructions (Addendum)
We will manage this as a viral syndrome. For sore throat or cough try using a honey-based tea. Use 3 teaspoons of honey with juice squeezed from half lemon. Place shaved pieces of ginger into 1/2-1 cup of water and warm over stove top. Then mix the ingredients and repeat every 4 hours as needed. Please take Tylenol 500mg every 6 hours. Hydrate very well with at least 2 liters of water. Eat light meals such as soups to replenish electrolytes and soft fruits, veggies. Start an antihistamine like Zyrtec, Allegra or Claritin for postnasal drainage, sinus congestion.  

## 2018-11-13 NOTE — ED Triage Notes (Signed)
Pt presents for Covid Testing after family member tested positive.

## 2018-11-13 NOTE — ED Provider Notes (Signed)
MRN: [id number] DOB: April 06, 1956  Subjective:   Margaret Hill is a 63 y.o. female presenting for COVID-19 testing.  Patient states that she was taking care of her granddaughter, she recently became ill and tested positive for COVID-19.  She also takes care of her grandson who is not yet manifesting symptoms.  Patient reports that she is doing well and wants to do this as a precaution.   No current facility-administered medications for this encounter.   Current Outpatient Medications:  .  Famotidine 20 MG CHEW, Chew 10 mg by mouth daily., Disp: , Rfl:  .  fluocinonide ointment (LIDEX) 6.94 %, Apply 1 application topically 2 (two) times daily., Disp: , Rfl:  .  losartan-hydrochlorothiazide (HYZAAR) 50-12.5 MG tablet, Take 1 tablet by mouth daily., Disp: 90 tablet, Rfl: 1 .  Pitavastatin Calcium (LIVALO) 4 MG TABS, Take 1 tablet (4 mg total) by mouth daily., Disp: 90 tablet, Rfl: 1 .  predniSONE (STERAPRED UNI-PAK 21 TAB) 10 MG (21) TBPK tablet, Take as directed, Disp: 21 tablet, Rfl: 0 .  valACYclovir (VALTREX) 500 MG tablet, Take 500 mg by mouth 2 (two) times daily., Disp: , Rfl:     Allergies  Allergen Reactions  . Aspirin     REACTION: stomach pain  . Ceftriaxone Sodium   . Codeine     REACTION: shortness of breath  . Doxycycline     REACTION: hives/skin rash  . Penicillins     REACTION: hives/skin rash  . Simvastatin     REACTION: muscular pain  . Sulfonamide Derivatives     REACTION: hives/skin rash  . Trimethoprim     REACTION: Hives/skin rash    Past Medical History:  Diagnosis Date  . Fibromyalgia   . Hypercholesteremia   . Hypertension      History reviewed. No pertinent surgical history.  Review of Systems  Constitutional: Negative for fever and malaise/fatigue.  HENT: Negative for congestion, ear pain, sinus pain and sore throat.   Eyes: Negative for blurred vision, double vision, discharge and redness.  Respiratory: Negative for cough, hemoptysis,  shortness of breath and wheezing.   Cardiovascular: Negative for chest pain.  Gastrointestinal: Negative for abdominal pain, diarrhea, nausea and vomiting.  Genitourinary: Negative for dysuria, flank pain and hematuria.  Musculoskeletal: Negative for myalgias.  Skin: Negative for rash.  Neurological: Negative for dizziness, weakness and headaches.  Psychiatric/Behavioral: Negative for depression and substance abuse.    Objective:   Vitals: BP 139/82 (BP Location: Left Arm)   Pulse 94   Temp 99 F (37.2 C) (Oral)   Resp 18   SpO2 100%   Physical Exam Constitutional:      General: She is not in acute distress.    Appearance: Normal appearance. She is well-developed. She is not ill-appearing, toxic-appearing or diaphoretic.  HENT:     Head: Normocephalic and atraumatic.     Nose: Nose normal.     Mouth/Throat:     Mouth: Mucous membranes are moist.  Eyes:     Extraocular Movements: Extraocular movements intact.     Pupils: Pupils are equal, round, and reactive to light.  Cardiovascular:     Rate and Rhythm: Normal rate and regular rhythm.     Pulses: Normal pulses.     Heart sounds: Normal heart sounds. No murmur. No friction rub. No gallop.   Pulmonary:     Effort: Pulmonary effort is normal. No respiratory distress.     Breath sounds: Normal breath sounds. No stridor. No wheezing,  rhonchi or rales.  Skin:    General: Skin is warm and dry.     Findings: No rash.  Neurological:     Mental Status: She is alert and oriented to person, place, and time.  Psychiatric:        Mood and Affect: Mood normal.        Behavior: Behavior normal.        Thought Content: Thought content normal.     Assessment and Plan :   1. Close Exposure to Covid-19 Virus     Likely viral in etiology. Counseled patient on nature of COVID-19 including modes of transmission, diagnostic testing, management and supportive care.  Offered symptomatic relief. COVID 19 testing is pending. Counseled  patient on potential for adverse effects with medications prescribed/recommended today, ER and return-to-clinic precautions discussed, patient verbalized understanding.     Jaynee Eagles, [place] 11/13/18 1436

## 2018-11-15 LAB — NOVEL CORONAVIRUS, NAA (HOSP ORDER, SEND-OUT TO REF LAB; TAT 18-24 HRS): SARS-CoV-2, NAA: NOT DETECTED

## 2018-11-16 ENCOUNTER — Encounter (HOSPITAL_COMMUNITY): Payer: Self-pay

## 2018-12-14 ENCOUNTER — Ambulatory Visit: Payer: 59 | Admitting: Nurse Practitioner

## 2018-12-14 ENCOUNTER — Encounter: Payer: Self-pay | Admitting: Nurse Practitioner

## 2018-12-14 ENCOUNTER — Other Ambulatory Visit: Payer: Self-pay

## 2018-12-14 VITALS — BP 160/90 | HR 89 | Temp 98.4°F | Ht 63.6 in | Wt 190.6 lb

## 2018-12-14 DIAGNOSIS — R030 Elevated blood-pressure reading, without diagnosis of hypertension: Secondary | ICD-10-CM

## 2018-12-14 DIAGNOSIS — M797 Fibromyalgia: Secondary | ICD-10-CM

## 2018-12-14 MED ORDER — KETOROLAC TROMETHAMINE 60 MG/2ML IM SOLN
60.0000 mg | Freq: Once | INTRAMUSCULAR | Status: AC
  Administered 2018-12-14: 60 mg via INTRAMUSCULAR

## 2018-12-14 MED ORDER — TRIAMCINOLONE ACETONIDE 40 MG/ML IJ SUSP
40.0000 mg | Freq: Once | INTRAMUSCULAR | Status: AC
  Administered 2018-12-14: 40 mg via INTRAMUSCULAR

## 2018-12-14 NOTE — Progress Notes (Signed)
Subjective:     Patient ID: Margaret Hill , female    DOB: [birth year] , 63 y.o.   MRN: [id number]   Chief Complaint  Patient presents with  . Pain    generalized- across the shoulders and down right side    HPI  She is here today for generalized pain all over due to the humidity and weather changes for the last week. She has been trying to exercise. She is having pain greater than 10, she is having pain with when her clothes touch her skin.  Pain worse on left shoulder, in addition to ankle, knee and wrist.     She feels it is related to her fibromyalgia.  When she has these flares.    Wt Readings from Last 3 Encounters: 12/14/18 : 190 lb 9.6 oz (86.5 kg) 09/21/18 : 196 lb 6.4 oz (89.1 kg) 04/28/18 : 192 lb 3.2 oz (87.2 kg)     Past Medical History:  Diagnosis Date  . Fibromyalgia   . Hypercholesteremia   . Hypertension      Family History  Family history unknown: Yes     Current Outpatient Medications:  .  Famotidine 20 MG CHEW, Chew 10 mg by mouth daily., Disp: , Rfl:  .  fluocinonide ointment (LIDEX) [id number]-123456789 %, Apply 1 application topically 2 (two) times daily., Disp: , Rfl:  .  losartan-hydrochlorothiazide (HYZAAR) 50-12.5 MG tablet, Take 1 tablet by mouth daily., Disp: 90 tablet, Rfl: 1 .  Pitavastatin Calcium (LIVALO) 4 MG TABS, Take 1 tablet (4 mg total) by mouth daily., Disp: 90 tablet, Rfl: 1 .  valACYclovir (VALTREX) 500 MG tablet, Take 500 mg by mouth 2 (two) times daily., Disp: , Rfl:  .  predniSONE (STERAPRED UNI-PAK 21 TAB) 10 MG (21) TBPK tablet, Take as directed (Patient not taking: Reported on 12/14/2018), Disp: 21 tablet, Rfl: 0   Allergies  Allergen Reactions  . Aspirin     REACTION: stomach pain  . Ceftriaxone Sodium   . Codeine     REACTION: shortness of breath  . Doxycycline     REACTION: hives/skin rash  . Penicillins     REACTION: hives/skin rash  . Simvastatin     REACTION: muscular pain  . Sulfonamide Derivatives     REACTION:  hives/skin rash  . Trimethoprim     REACTION: Hives/skin rash     Review of Systems  Constitutional: Negative.   Respiratory: Negative.  Negative for cough.   Cardiovascular: Negative.  Negative for chest pain, palpitations and leg swelling.  Endocrine: Negative for polydipsia, polyphagia and polyuria.  Musculoskeletal: Positive for arthralgias, back pain, myalgias and neck pain.  Neurological: Negative for dizziness and headaches.  Psychiatric/Behavioral: Negative.      Today's Vitals   12/14/18 1528  BP: (!) 160/90  Pulse: 89  Temp: 98.4 F (36.9 C)  TempSrc: Oral  SpO2: 97%  Weight: 190 lb 9.6 oz (86.5 kg)  Height: 5' 3.6" (1.615 m)   Body mass index is 33.13 kg/m.   Objective:  Physical Exam Constitutional:      Appearance: Normal appearance.  Cardiovascular:     Rate and Rhythm: Normal rate and regular rhythm.     Pulses: Normal pulses.     Heart sounds: Normal heart sounds. No murmur.  Pulmonary:     Effort: Pulmonary effort is normal. No respiratory distress.     Breath sounds: Normal breath sounds.  Skin:    Capillary Refill: Capillary refill takes less than 2  seconds.  Neurological:     General: No focal deficit present.     Mental Status: She is alert.  Psychiatric:        Mood and Affect: Mood normal.        Thought Content: Thought content normal.        Judgment: Judgment normal.         Assessment And Plan:     1. Fibromyalgia  3 week history of worsening pain related to her fibromyalgia  Continue to try and exercise to help flares  60 mg toradol and 40 mg of kenalog given IM - ketorolac (TORADOL) injection 60 mg - triamcinolone acetonide (KENALOG-40) injection 40 mg  2. Elevated blood pressure, situational  This is likely related to her current pain level  Advised to check her blood pressure at home and if continues to be above 140/90 to call to office.      [name], FNP    THE PATIENT IS ENCOURAGED TO PRACTICE SOCIAL  DISTANCING DUE TO THE COVID-19 PANDEMIC.

## 2018-12-28 ENCOUNTER — Ambulatory Visit: Payer: 59

## 2018-12-28 ENCOUNTER — Other Ambulatory Visit: Payer: Self-pay

## 2018-12-28 VITALS — BP 142/84 | HR 74 | Temp 98.0°F | Ht 63.6 in | Wt 189.6 lb

## 2018-12-28 DIAGNOSIS — I10 Essential (primary) hypertension: Secondary | ICD-10-CM

## 2018-12-28 NOTE — Progress Notes (Signed)
Pt presents today for b/p check 142/84

## 2019-01-11 ENCOUNTER — Encounter: Payer: Self-pay | Admitting: Nurse Practitioner

## 2019-01-11 ENCOUNTER — Other Ambulatory Visit: Payer: Self-pay | Admitting: Internal Medicine

## 2019-01-11 DIAGNOSIS — Z1231 Encounter for screening mammogram for malignant neoplasm of breast: Secondary | ICD-10-CM

## 2019-01-26 ENCOUNTER — Encounter: Payer: Self-pay | Admitting: Nurse Practitioner

## 2019-02-25 ENCOUNTER — Other Ambulatory Visit: Payer: Self-pay

## 2019-02-25 ENCOUNTER — Ambulatory Visit
Admission: RE | Admit: 2019-02-25 | Discharge: 2019-02-25 | Disposition: A | Discharge status: Home / Self Care | Payer: 59 | Source: Ambulatory Visit | Attending: Internal Medicine | Admitting: Internal Medicine

## 2019-02-25 DIAGNOSIS — Z1231 Encounter for screening mammogram for malignant neoplasm of breast: Secondary | ICD-10-CM

## 2019-03-13 ENCOUNTER — Other Ambulatory Visit: Payer: Self-pay | Admitting: Nurse Practitioner

## 2019-03-13 DIAGNOSIS — I10 Essential (primary) hypertension: Secondary | ICD-10-CM

## 2019-03-19 ENCOUNTER — Other Ambulatory Visit: Payer: Self-pay

## 2019-03-19 MED ORDER — FLUOCINONIDE 0.05 % EX OINT: 1.0000 "application " | TOPICAL_OINTMENT | Freq: Two times a day (BID) | CUTANEOUS | 1 refills | Status: DC

## 2019-03-25 ENCOUNTER — Encounter: Payer: Self-pay | Admitting: Nurse Practitioner

## 2019-03-25 ENCOUNTER — Other Ambulatory Visit: Payer: Self-pay

## 2019-03-25 ENCOUNTER — Ambulatory Visit: Payer: 59 | Admitting: Nurse Practitioner

## 2019-03-25 VITALS — BP 112/70 | HR 76 | Temp 98.6°F | Ht 62.6 in | Wt 185.6 lb

## 2019-03-25 DIAGNOSIS — Z0000 Encounter for general adult medical examination without abnormal findings: Secondary | ICD-10-CM | POA: Diagnosis not present

## 2019-03-25 DIAGNOSIS — R109 Unspecified abdominal pain: Secondary | ICD-10-CM

## 2019-03-25 DIAGNOSIS — Z1211 Encounter for screening for malignant neoplasm of colon: Secondary | ICD-10-CM

## 2019-03-25 DIAGNOSIS — E559 Vitamin D deficiency, unspecified: Secondary | ICD-10-CM

## 2019-03-25 DIAGNOSIS — R7303 Prediabetes: Secondary | ICD-10-CM

## 2019-03-25 DIAGNOSIS — I10 Essential (primary) hypertension: Secondary | ICD-10-CM

## 2019-03-25 DIAGNOSIS — E785 Hyperlipidemia, unspecified: Secondary | ICD-10-CM | POA: Diagnosis not present

## 2019-03-25 LAB — POCT URINALYSIS DIPSTICK
Glucose, UA: NEGATIVE
Ketones, UA: NEGATIVE
Leukocytes, UA: NEGATIVE
Nitrite, UA: NEGATIVE
Protein, UA: POSITIVE — AB
Spec Grav, UA: 1.025 (ref 1.010–1.025)
Urobilinogen, UA: 0.2 E.U./dL
pH, UA: 6 (ref 5.0–8.0)

## 2019-03-25 LAB — POCT UA - MICROALBUMIN
Albumin/Creatinine Ratio, Urine, POC: 300
Creatinine, POC: 300 mg/dL
Microalbumin Ur, POC: 80 mg/L

## 2019-03-25 NOTE — Patient Instructions (Addendum)
Health Maintenance  Topic Date Due  . COLONOSCOPY  02/24/2006  . TETANUS/TDAP  01/27/2021  . MAMMOGRAM  02/24/2021  . PAP SMEAR-Modifier  03/13/2021  . INFLUENZA VACCINE  Completed  . Hepatitis C Screening  Completed  . HIV Screening  Completed   Health Maintenance, Female Adopting a healthy lifestyle and getting preventive care are important in promoting health and wellness. Ask your health care provider about:  The right schedule for you to have regular tests and exams.  Things you can do on your own to prevent diseases and keep yourself healthy. What should I know about diet, weight, and exercise? Eat a healthy diet   Eat a diet that includes plenty of vegetables, fruits, low-fat dairy products, and lean protein.  Do not eat a lot of foods that are high in solid fats, added sugars, or sodium. Maintain a healthy weight Body mass index (BMI) is used to identify weight problems. It estimates body fat based on height and weight. Your health care provider can help determine your BMI and help you achieve or maintain a healthy weight. Get regular exercise Get regular exercise. This is one of the most important things you can do for your health. Most adults should:  Exercise for at least 150 minutes each week. The exercise should increase your heart rate and make you sweat (moderate-intensity exercise).  Do strengthening exercises at least twice a week. This is in addition to the moderate-intensity exercise.  Spend less time sitting. Even light physical activity can be beneficial. Watch cholesterol and blood lipids Have your blood tested for lipids and cholesterol at 63 years of age, then have this test every 5 years. Have your cholesterol levels checked more often if:  Your lipid or cholesterol levels are high.  You are older than 63 years of age.  You are at high risk for heart disease. What should I know about cancer screening? Depending on your health history and family  history, you may need to have cancer screening at various ages. This may include screening for:  Breast cancer.  Cervical cancer.  Colorectal cancer.  Skin cancer.  Lung cancer. What should I know about heart disease, diabetes, and high blood pressure? Blood pressure and heart disease  High blood pressure causes heart disease and increases the risk of stroke. This is more likely to develop in people who have high blood pressure readings, are of African descent, or are overweight.  Have your blood pressure checked: ? Every 3-5 years if you are 56-60 years of age. ? Every year if you are 43 years old or older. Diabetes Have regular diabetes screenings. This checks your fasting blood sugar level. Have the screening done:  Once every three years after age 65 if you are at a normal weight and have a low risk for diabetes.  More often and at a younger age if you are overweight or have a high risk for diabetes. What should I know about preventing infection? Hepatitis B If you have a higher risk for hepatitis B, you should be screened for this virus. Talk with your health care provider to find out if you are at risk for hepatitis B infection. Hepatitis C Testing is recommended for:  Everyone born from [birth year] through 1965.  Anyone with known risk factors for hepatitis C. Sexually transmitted infections (STIs)  Get screened for STIs, including gonorrhea and chlamydia, if: ? You are sexually active and are younger than 63 years of age. ? You are older than  63 years of age and your health care provider tells you that you are at risk for this type of infection. ? Your sexual activity has changed since you were last screened, and you are at increased risk for chlamydia or gonorrhea. Ask your health care provider if you are at risk.  Ask your health care provider about whether you are at high risk for HIV. Your health care provider may recommend a prescription medicine to help prevent HIV  infection. If you choose to take medicine to prevent HIV, you should first get tested for HIV. You should then be tested every 3 months for as long as you are taking the medicine. Pregnancy  If you are about to stop having your period (premenopausal) and you may become pregnant, seek counseling before you get pregnant.  Take 400 to 800 micrograms (mcg) of folic acid every day if you become pregnant.  Ask for birth control (contraception) if you want to prevent pregnancy. Osteoporosis and menopause Osteoporosis is a disease in which the bones lose minerals and strength with aging. This can result in bone fractures. If you are 48 years old or older, or if you are at risk for osteoporosis and fractures, ask your health care provider if you should:  Be screened for bone loss.  Take a calcium or vitamin D supplement to lower your risk of fractures.  Be given hormone replacement therapy (HRT) to treat symptoms of menopause. Follow these instructions at home: Lifestyle  Do not use any products that contain nicotine or tobacco, such as cigarettes, e-cigarettes, and chewing tobacco. If you need help quitting, ask your health care provider.  Do not use street drugs.  Do not share needles.  Ask your health care provider for help if you need support or information about quitting drugs. Alcohol use  Do not drink alcohol if: ? Your health care provider tells you not to drink. ? You are pregnant, may be pregnant, or are planning to become pregnant.  If you drink alcohol: ? Limit how much you use to 0-1 drink a day. ? Limit intake if you are breastfeeding.  Be aware of how much alcohol is in your drink. In the [place] one drink equals one 12 oz bottle of beer (355 mL), one 5 oz glass of wine (148 mL), or one 1 oz glass of hard liquor (44 mL). General instructions  Schedule regular health, dental, and eye exams.  Stay current with your vaccines.  Tell your health care provider if: ? You  often feel depressed. ? You have ever been abused or do not feel safe at home. Summary  Adopting a healthy lifestyle and getting preventive care are important in promoting health and wellness.  Follow your health care provider's instructions about healthy diet, exercising, and getting tested or screened for diseases.  Follow your health care provider's instructions on monitoring your cholesterol and blood pressure. This information is not intended to replace advice given to you by your health care provider. Make sure you discuss any questions you have with your health care provider. Document Released: 10/15/2010 Document Revised: 03/25/2018 Document Reviewed: 03/25/2018 Elsevier Patient Education  2020 [employer].   Call Dr. Collene Mares to schedule your colonoscopy [phone]  You can take [employer] [place] for the stomach discomfort

## 2019-03-25 NOTE — Progress Notes (Signed)
Subjective:     Patient ID: Margaret Hill , female    DOB: 11/19/1955 , 63 y.o.   MRN: [id number]   Chief Complaint  Patient presents with  . Annual Exam   The patient states is  post menopausal status.  Mammogram last done 02/06/2018 Negative for: breast discharge, breast lump(s), breast pain and breast self exam.  Pertinent negatives include abnormal bleeding (hematology), anxiety, decreased libido, depression, difficulty falling sleep, dyspareunia, history of infertility, nocturia, sexual dysfunction, sleep disturbances, urinary incontinence, urinary urgency, vaginal discharge and vaginal itching. Diet regular, limits sweets. The patient states her exercise level is moderate exercising.     The patient's tobacco use is:   Social History   Tobacco Use  Smoking Status Never Smoker  Smokeless Tobacco Never Used   She has been exposed to passive smoke. The patient's alcohol use is:   Social History   Substance and Sexual Activity  Alcohol Use Yes  Additional information: Last pap 02/2018 normal , next one scheduled for 2022  HPI  Here for HM  Obesity - she is exercising daily Wt Readings from Last 3 Encounters: 03/25/19 : 185 lb 9.6 oz (84.2 kg) 12/28/18 : 189 lb 9.6 oz (86 kg) 12/14/18 : 190 lb 9.6 oz (86.5 kg)   She is taking sea moss two times a day and feels her fibromyalgia is better.    Hypertension This is a chronic problem. The current episode started more than 1 year ago. The problem is unchanged. The problem is controlled. Pertinent negatives include no anxiety, chest pain or palpitations. There are no associated agents to hypertension. Risk factors for coronary artery disease include sedentary lifestyle. The current treatment provides no improvement. There are no compliance problems.  There is no history of kidney disease. There is no history of chronic renal disease.     Past Medical History:  Diagnosis Date  . Fibromyalgia   . Hypercholesteremia   .  Hypertension      Family History  Family history unknown: Yes     Current Outpatient Medications:  .  Famotidine 20 MG CHEW, Chew 10 mg by mouth daily., Disp: , Rfl:  .  fluocinonide ointment (LIDEX) 3.23 %, Apply 1 application topically 2 (two) times daily., Disp: 30 g, Rfl: 1 .  losartan-hydrochlorothiazide (HYZAAR) 50-12.5 MG tablet, TAKE 1 TABLET BY MOUTH DAILY, Disp: 90 tablet, Rfl: 1 .  Pitavastatin Calcium (LIVALO) 4 MG TABS, Take 1 tablet (4 mg total) by mouth daily., Disp: 90 tablet, Rfl: 1 .  valACYclovir (VALTREX) 500 MG tablet, Take 500 mg by mouth 2 (two) times daily., Disp: , Rfl:    Allergies  Allergen Reactions  . Aspirin     REACTION: stomach pain  . Ceftriaxone Sodium   . Codeine     REACTION: shortness of breath  . Doxycycline     REACTION: hives/skin rash  . Penicillins     REACTION: hives/skin rash  . Simvastatin     REACTION: muscular pain  . Sulfonamide Derivatives     REACTION: hives/skin rash  . Trimethoprim     REACTION: Hives/skin rash     Review of Systems  Constitutional: Negative.   HENT: Negative.   Eyes: Negative.   Respiratory: Negative.   Cardiovascular: Negative.  Negative for chest pain, palpitations and leg swelling.  Gastrointestinal:       Stomach burning sensation  Endocrine: Negative.   Genitourinary: Negative.   Musculoskeletal: Negative.   Skin: Negative.   Allergic/Immunologic: Negative.  Neurological: Negative.   Hematological: Negative.   Psychiatric/Behavioral: Negative.      Today's Vitals   03/25/19 0930  BP: 112/70  Pulse: 76  Temp: 98.6 F (37 C)  TempSrc: Oral  Weight: 185 lb 9.6 oz (84.2 kg)  Height: 5' 2.6" (1.59 m)  PainSc: 0-No pain   Body mass index is 33.3 kg/m.   Objective:  Physical Exam Constitutional:      General: She is not in acute distress.    Appearance: Normal appearance. She is well-developed. She is obese.  HENT:     Head: Normocephalic and atraumatic.     Right Ear:  Hearing, tympanic membrane, ear canal and external ear normal.     Left Ear: Hearing, tympanic membrane, ear canal and external ear normal.  Eyes:     General: Lids are normal.     Extraocular Movements: Extraocular movements intact.     Conjunctiva/sclera: Conjunctivae normal.     Pupils: Pupils are equal, round, and reactive to light.     Funduscopic exam:    Right eye: No papilledema.        Left eye: No papilledema.  Neck:     Thyroid: No thyroid mass.     Vascular: No carotid bruit.  Cardiovascular:     Rate and Rhythm: Normal rate and regular rhythm.     Pulses: Normal pulses.     Heart sounds: Normal heart sounds. No murmur.  Pulmonary:     Effort: Pulmonary effort is normal.     Breath sounds: Normal breath sounds.  Abdominal:     General: Bowel sounds are normal.     Palpations: Abdomen is soft.     Tenderness: There is abdominal tenderness (slightly tender to left abdomen).  Musculoskeletal:        General: Normal range of motion.     Cervical back: Full passive range of motion without pain, normal range of motion and neck supple.  Skin:    General: Skin is warm and dry.     Capillary Refill: Capillary refill takes less than 2 seconds.  Neurological:     General: No focal deficit present.     Mental Status: She is alert and oriented to person, place, and time.     Cranial Nerves: No cranial nerve deficit.     Sensory: No sensory deficit.  Psychiatric:        Behavior: Behavior normal.        Thought Content: Thought content normal.        Judgment: Judgment normal.         Assessment And Plan:      1. Routine general medical examination at a health care facility . Behavior modifications discussed and diet history reviewed.   . Pt will continue to exercise regularly and modify diet with low GI, plant based foods and decrease intake of processed foods.  . Recommend intake of daily multivitamin, Vitamin D, and calcium.  . Recommend mammogram and colonoscopy  for preventive screenings, as well as recommend immunizations that include influenza, TDAP - CMP14+EGFR - CBC without diff  2. Encounter for screening colonoscopy  According to USPTF Colorectal cancer Screening guidelines. Colonoscopy is recommended every 10 years, starting at age 34years.  Will refer to GI for colon cancer screening. - Ambulatory referral to Gastroenterology  3. HYPERTENSION, BENIGN ESSENTIAL . B/P is controlled.  . CMP ordered to check renal function.  . The importance of regular exercise and dietary modification was stressed to the  patient.  - POCT Urinalysis Dipstick (81002) - POCT UA - Microalbumin - EKG 12-Lead  4. Hyperlipidemia, unspecified hyperlipidemia type  Chronic, controlled  Continue with current medications - Lipid Profile  5. Prediabetes  Chronic, stable  No current medications  Encouraged to limit intake of sugary foods and drinks  Encouraged to increase physical activity to 150 minutes per week - Hemoglobin A1c  6. Vitamin D deficiency  Will check vitamin D level and supplement as needed.     Also encouraged to spend 15 minutes in the sun daily.  - Vitamin D (25 hydroxy)  7. Stomach pain  She has had ongoing burning pain just below xyphoid process  Will check H pylori - H Pylori, IGM, IGG, IGA AB      [name], FNP  Patient ID: Margaret Hill, female   DOB: 1955/11/05, 63 y.o.   MRN: [id number]

## 2019-03-26 LAB — CMP14+EGFR
ALT: 11 IU/L (ref 0–32)
AST: 13 IU/L (ref 0–40)
Albumin/Globulin Ratio: 1.8 (ref 1.2–2.2)
Albumin: 4.4 g/dL (ref 3.8–4.8)
Alkaline Phosphatase: 62 IU/L (ref 39–117)
BUN/Creatinine Ratio: 18 (ref 12–28)
BUN: 14 mg/dL (ref 8–27)
Bilirubin Total: 0.5 mg/dL (ref 0.0–1.2)
CO2: 23 mmol/L (ref 20–29)
Calcium: 9.3 mg/dL (ref 8.7–10.3)
Chloride: 101 mmol/L (ref 96–106)
Creatinine, Ser: 0.78 mg/dL (ref 0.57–1.00)
GFR calc Af Amer: 94 mL/min/{1.73_m2} (ref >59)
GFR calc non Af Amer: 81 mL/min/{1.73_m2} (ref >59)
Globulin, Total: 2.4 g/dL (ref 1.5–4.5)
Glucose: 83 mg/dL (ref 65–99)
Potassium: 4.3 mmol/L (ref 3.5–5.2)
Sodium: 140 mmol/L (ref 134–144)
Total Protein: 6.8 g/dL (ref 6.0–8.5)

## 2019-03-26 LAB — CBC
Hematocrit: 35.4 % (ref 34.0–46.6)
Hemoglobin: 11.6 g/dL (ref 11.1–15.9)
MCH: 27.4 pg (ref 26.6–33.0)
MCHC: 32.8 g/dL (ref 31.5–35.7)
MCV: 84 fL (ref 79–97)
Platelets: 442 10*3/uL (ref 150–450)
RBC: 4.23 x10E6/uL (ref 3.77–5.28)
RDW: 13.6 % (ref 11.7–15.4)
WBC: 4.3 10*3/uL (ref 3.4–10.8)

## 2019-03-26 LAB — LIPID PANEL
Chol/HDL Ratio: 2.4 ratio (ref 0.0–4.4)
Cholesterol, Total: 176 mg/dL (ref 100–199)
HDL: 73 mg/dL (ref >39)
LDL Chol Calc (NIH): 92 mg/dL (ref 0–99)
Triglycerides: 55 mg/dL (ref 0–149)
VLDL Cholesterol Cal: 11 mg/dL (ref 5–40)

## 2019-03-26 LAB — VITAMIN D 25 HYDROXY (VIT D DEFICIENCY, FRACTURES): Vit D, 25-Hydroxy: 18.1 ng/mL — ABNORMAL LOW (ref 30.0–100.0)

## 2019-03-26 LAB — H PYLORI, IGM, IGG, IGA AB
H pylori, IgM Abs: <9 units (ref 0.0–8.9)
H. pylori, IgA Abs: <9 units (ref 0.0–8.9)
H. pylori, IgG AbS: 0.24 Index Value (ref 0.00–0.79)

## 2019-03-26 LAB — HEMOGLOBIN A1C
Est. average glucose Bld gHb Est-mCnc: 128 mg/dL
Hgb A1c MFr Bld: 6.1 % — ABNORMAL HIGH (ref 4.8–5.6)

## 2019-03-29 ENCOUNTER — Other Ambulatory Visit: Payer: Self-pay | Admitting: Nurse Practitioner

## 2019-03-29 DIAGNOSIS — E559 Vitamin D deficiency, unspecified: Secondary | ICD-10-CM

## 2019-03-29 MED ORDER — VITAMIN D (ERGOCALCIFEROL) 1.25 MG (50000 UNIT) PO CAPS: 50000.0000 [IU] | ORAL_CAPSULE | ORAL | 1 refills | Status: DC

## 2019-05-04 ENCOUNTER — Telehealth (INDEPENDENT_AMBULATORY_CARE_PROVIDER_SITE_OTHER): Payer: 59 | Admitting: Nurse Practitioner

## 2019-05-04 ENCOUNTER — Encounter: Payer: Self-pay | Admitting: Nurse Practitioner

## 2019-05-04 ENCOUNTER — Other Ambulatory Visit: Payer: Self-pay

## 2019-05-04 VITALS — Ht 62.0 in | Wt 182.0 lb

## 2019-05-04 DIAGNOSIS — R059 Cough, unspecified: Secondary | ICD-10-CM

## 2019-05-04 DIAGNOSIS — R509 Fever, unspecified: Secondary | ICD-10-CM

## 2019-05-04 DIAGNOSIS — U071 COVID-19: Secondary | ICD-10-CM

## 2019-05-04 DIAGNOSIS — R519 Headache, unspecified: Secondary | ICD-10-CM

## 2019-05-04 DIAGNOSIS — R05 Cough: Secondary | ICD-10-CM | POA: Diagnosis not present

## 2019-05-04 MED ORDER — ALBUTEROL SULFATE HFA 108 (90 BASE) MCG/ACT IN AERS: 2.0000 | INHALATION_SPRAY | Freq: Four times a day (QID) | RESPIRATORY_TRACT | 2 refills | Status: DC | PRN

## 2019-05-04 NOTE — Progress Notes (Signed)
Virtual Visit via Video   This visit type was conducted due to national recommendations for restrictions regarding the COVID-19 Pandemic (e.g. social distancing) in an effort to limit this patient's exposure and mitigate transmission in our community.  Due to her co-morbid illnesses, this patient is at least at moderate risk for complications without adequate follow up.  This format is felt to be most appropriate for this patient at this time.  All issues noted in this document were discussed and addressed.  A limited physical exam was performed with this format.    This visit type was conducted due to national recommendations for restrictions regarding the COVID-19 Pandemic (e.g. social distancing) in an effort to limit this patient's exposure and mitigate transmission in our community.  Patients identity confirmed using two different identifiers.  This format is felt to be most appropriate for this patient at this time.  All issues noted in this document were discussed and addressed.  No physical exam was performed (except for noted visual exam findings with Video Visits).    Date:  05/11/2019   ID:  Margaret Hill, DOB 12-10-1955, MRN [id number]  Patient Location:  Home - spoke with Margaret Hill  Provider location:   Office    Chief Complaint:  Positive covid  History of Present Illness:    Margaret Hill is a 64 y.o. female who presents via video conferencing for a telehealth visit today.    The patient does have symptoms concerning for COVID-19 infection (fever, chills, cough, or new shortness of breath).   On Saturday she began running a fever of 101.5 until this morning, sore throat, headache like she has never had, coughing, and really tired.  Her throat felt scratchy.  She was tested at [facility] on [facility]. She has been taking ibuprofen. She is able to taste or smell.  Will have shortness of breath when she exerts herself.  She        Past Medical History:   Diagnosis Date  . Fibromyalgia   . Hypercholesteremia   . Hypertension    No past surgical history on file.   Current Meds  Medication Sig  . Famotidine 20 MG CHEW Chew 10 mg by mouth daily.  . fluocinonide ointment (LIDEX) AB-123456789 % Apply 1 application topically 2 (two) times daily.  Marland Kitchen losartan-hydrochlorothiazide (HYZAAR) 50-12.5 MG tablet TAKE 1 TABLET BY MOUTH DAILY  . Pitavastatin Calcium (LIVALO) 4 MG TABS Take 1 tablet (4 mg total) by mouth daily.  . valACYclovir (VALTREX) 500 MG tablet Take 500 mg by mouth 2 (two) times daily.  . Vitamin D, Ergocalciferol, (DRISDOL) 1.25 MG (50000 UT) CAPS capsule Take 1 capsule (50,000 Units total) by mouth 2 (two) times a week.     Allergies:   Aspirin, Ceftriaxone sodium, Codeine, Doxycycline, Penicillins, Simvastatin, Sulfonamide derivatives, and Trimethoprim   Social History   Tobacco Use  . Smoking status: Never Smoker  . Smokeless tobacco: Never Used  Substance Use Topics  . Alcohol use: Yes  . Drug use: No     Family Hx: The patient's Family history is unknown by patient.  ROS:   Please see the history of present illness.    Review of Systems  Constitutional: Negative.  Negative for fever.  HENT: Positive for sore throat.   Respiratory: Positive for cough. Negative for shortness of breath.   Cardiovascular: Negative.   Musculoskeletal: Positive for myalgias.  Neurological: Positive for headaches. Negative for dizziness and tingling.  Psychiatric/Behavioral:  Negative.     All other systems reviewed and are negative.   Labs/Other Tests and Data Reviewed:    Recent Labs: 03/25/2019: ALT 11; BUN 14; Creatinine, Ser 0.78; Hemoglobin 11.6; Platelets 442; Potassium 4.3; Sodium 140   Recent Lipid Panel Lab Results  Component Value Date/Time   CHOL 176 03/25/2019 03:39 PM   TRIG 55 03/25/2019 03:39 PM   HDL 73 03/25/2019 03:39 PM   CHOLHDL 2.4 03/25/2019 03:39 PM   CHOLHDL 2.2 Ratio 01/22/2008 10:54 PM   LDLCALC 92  03/25/2019 03:39 PM    Wt Readings from Last 3 Encounters:  05/04/19 182 lb (82.6 kg)  03/25/19 185 lb 9.6 oz (84.2 kg)  12/28/18 189 lb 9.6 oz (86 kg)     Exam:    Vital Signs:  Ht 5\' 2"  (1.575 m)   Wt 182 lb (82.6 kg)   BMI 33.29 kg/m     Physical Exam  Constitutional: She is oriented to person, place, and time. No distress.  Pulmonary/Chest: No respiratory distress.  Neurological: She is alert and oriented to person, place, and time.  Psychiatric: Mood, memory, affect and judgment normal.    ASSESSMENT & PLAN:    1. Acute nonintractable headache, unspecified headache type  Advised to take tylenol as needed  2. Cough  Intermittent hacking cough  Will treat with albuterol inhaler if have shortness of breath she is to go to ER for further evaluation - albuterol (VENTOLIN HFA) 108 (90 Base) MCG/ACT inhaler; Inhale 2 puffs into the lungs every 6 (six) hours as needed for wheezing or shortness of breath.  Dispense: 18 g; Refill: 2  3. Fever, unspecified fever cause  Take tylenol as needed  4.  COVID-19 infection  Went to [place] [facility] on [address] and tested positive for covid.    Per per patient   [id number] Education: The signs and symptoms of COVID-19 were discussed with the patient and how to seek care for testing (follow up with PCP or arrange E-visit).  The importance of social distancing was discussed today.  Patient Risk:   After full review of this patients clinical status, I feel that they are at least moderate risk at this time.  Time:   Today, I have spent 12 minutes/ seconds with the patient with telehealth technology discussing above diagnoses.     Medication Adjustments/Labs and Tests Ordered: Current medicines are reviewed at length with the patient today.  Concerns regarding medicines are outlined above.   Tests Ordered: No orders of the defined types were placed in this encounter.   Medication Changes: Meds ordered this encounter    Medications  . albuterol (VENTOLIN HFA) 108 (90 Base) MCG/ACT inhaler    Sig: Inhale 2 puffs into the lungs every 6 (six) hours as needed for wheezing or shortness of breath.    Dispense:  18 g    Refill:  2    Disposition:  Follow up prn  Signed, [name], FNP

## 2019-05-11 ENCOUNTER — Encounter: Payer: Self-pay | Admitting: Nurse Practitioner

## 2019-05-12 ENCOUNTER — Encounter: Payer: Self-pay | Admitting: Nurse Practitioner

## 2019-05-14 ENCOUNTER — Ambulatory Visit: Payer: 59 | Attending: Internal Medicine

## 2019-05-14 DIAGNOSIS — Z20822 Contact with and (suspected) exposure to covid-19: Secondary | ICD-10-CM

## 2019-05-15 LAB — NOVEL CORONAVIRUS, NAA: SARS-CoV-2, NAA: NOT DETECTED

## 2019-06-14 ENCOUNTER — Telehealth: Payer: Self-pay

## 2019-06-14 NOTE — Telephone Encounter (Signed)
Patient's PA for [employer] has been submitted through covermymeds and we are waiting on the determination from the patient's ins. YRL,RMA

## 2019-06-16 ENCOUNTER — Encounter: Payer: Self-pay | Admitting: Nurse Practitioner

## 2019-06-16 ENCOUNTER — Other Ambulatory Visit: Payer: Self-pay

## 2019-06-16 ENCOUNTER — Ambulatory Visit: Payer: 59 | Admitting: Nurse Practitioner

## 2019-06-16 VITALS — BP 112/76 | HR 74 | Temp 97.8°F | Ht 62.6 in | Wt 182.0 lb

## 2019-06-16 DIAGNOSIS — H9202 Otalgia, left ear: Secondary | ICD-10-CM

## 2019-06-16 DIAGNOSIS — M2550 Pain in unspecified joint: Secondary | ICD-10-CM

## 2019-06-16 DIAGNOSIS — Z8616 Personal history of COVID-19: Secondary | ICD-10-CM

## 2019-06-16 MED ORDER — PREDNISONE 10 MG (21) PO TBPK: ORAL_TABLET | ORAL | 0 refills | Status: DC

## 2019-06-16 NOTE — Progress Notes (Signed)
This visit occurred during the SARS-CoV-2 public health emergency.  Safety protocols were in place, including screening questions prior to the visit, additional usage of staff PPE, and extensive cleaning of exam room while observing appropriate contact time as indicated for disinfecting solutions.  Subjective:     Patient ID: Margaret Hill , female    DOB: 1956/02/13 , 64 y.o.   MRN: [id number]   Chief Complaint  Patient presents with  . joint pain    patient stated she has been having joint pain since having covid     HPI  Joint pain all over since having covid.  She will take ibuprofen, she is not sleeping at night.  She is having cramps in her legs and abdomen.  She is staying well hydrated.  She did have pain to her joints with covid but got worse afterwards.  She has been wearing wrist splints.      Past Medical History:  Diagnosis Date  . Fibromyalgia   . Hypercholesteremia   . Hypertension      Family History  Family history unknown: Yes     Current Outpatient Medications:  .  albuterol (VENTOLIN HFA) 108 (90 Base) MCG/ACT inhaler, Inhale 2 puffs into the lungs every 6 (six) hours as needed for wheezing or shortness of breath., Disp: 18 g, Rfl: 2 .  Famotidine 20 MG CHEW, Chew 10 mg by mouth daily., Disp: , Rfl:  .  fluocinonide ointment (LIDEX) 3.32 %, Apply 1 application topically 2 (two) times daily., Disp: 30 g, Rfl: 1 .  losartan-hydrochlorothiazide (HYZAAR) 50-12.5 MG tablet, TAKE 1 TABLET BY MOUTH DAILY, Disp: 90 tablet, Rfl: 1 .  Pitavastatin Calcium (LIVALO) 4 MG TABS, Take 1 tablet (4 mg total) by mouth daily., Disp: 90 tablet, Rfl: 1 .  valACYclovir (VALTREX) 500 MG tablet, Take 500 mg by mouth 2 (two) times daily., Disp: , Rfl:  .  Vitamin D, Ergocalciferol, (DRISDOL) 1.25 MG (50000 UT) CAPS capsule, Take 1 capsule (50,000 Units total) by mouth 2 (two) times a week., Disp: 24 capsule, Rfl: 1   Allergies  Allergen Reactions  . Aspirin     REACTION:  stomach pain  . Ceftriaxone Sodium   . Codeine     REACTION: shortness of breath  . Doxycycline     REACTION: hives/skin rash  . Penicillins     REACTION: hives/skin rash  . Simvastatin     REACTION: muscular pain  . Sulfonamide Derivatives     REACTION: hives/skin rash  . Trimethoprim     REACTION: Hives/skin rash     Review of Systems  Constitutional: Negative.   Respiratory: Negative.   Cardiovascular: Negative.  Negative for chest pain, palpitations and leg swelling.  Neurological: Negative for dizziness and headaches.  Psychiatric/Behavioral: Negative.      Today's Vitals   06/16/19 1043  BP: 112/76  Pulse: 74  Temp: 97.8 F (36.6 C)  TempSrc: Oral  Weight: 182 lb (82.6 kg)  Height: 5' 2.6" (1.59 m)  PainSc: 9    Body mass index is 32.65 kg/m.   Objective:  Physical Exam Constitutional:      Appearance: Normal appearance.  Cardiovascular:     Rate and Rhythm: Normal rate.     Pulses: Normal pulses.     Heart sounds: Normal heart sounds. No murmur.  Pulmonary:     Effort: Pulmonary effort is normal. No respiratory distress.     Breath sounds: Normal breath sounds.  Musculoskeletal:  General: Tenderness (joints are tender) present. No swelling.  Skin:    Capillary Refill: Capillary refill takes less than 2 seconds.  Neurological:     General: No focal deficit present.     Mental Status: She is alert and oriented to person, place, and time.     Cranial Nerves: No cranial nerve deficit.  Psychiatric:        Mood and Affect: Mood normal.        Behavior: Behavior normal.        Thought Content: Thought content normal.        Judgment: Judgment normal.         Assessment And Plan:     1. Arthralgia, unspecified joint  Likely related to an inflammatory response  Will treat with prednisone taper if not better return call to office  Will also check inflammatory markers - predniSONE (STERAPRED UNI-PAK 21 TAB) 10 MG (21) TBPK tablet; Take as  directed  Dispense: 21 tablet; Refill: 0  2. History of COVID-19  Overall doing well except for the joint pain - Sed Rate (ESR)  3. Otalgia of left ear  TM bulging to left ear   Encouraged to take her nasal spray  [name], FNP    THE PATIENT IS ENCOURAGED TO PRACTICE SOCIAL DISTANCING DUE TO THE COVID-19 PANDEMIC.

## 2019-06-17 ENCOUNTER — Other Ambulatory Visit: Payer: Self-pay

## 2019-06-17 ENCOUNTER — Encounter: Payer: Self-pay | Admitting: Nurse Practitioner

## 2019-06-17 LAB — SEDIMENTATION RATE: Sed Rate: 18 mm/hr (ref 0–40)

## 2019-06-17 MED ORDER — ALBUTEROL SULFATE 108 (90 BASE) MCG/ACT IN AEPB: 2.0000 | INHALATION_SPRAY | Freq: Four times a day (QID) | RESPIRATORY_TRACT | 1 refills | Status: AC | PRN

## 2019-07-08 ENCOUNTER — Ambulatory Visit: Payer: 59

## 2019-07-11 ENCOUNTER — Other Ambulatory Visit: Payer: Self-pay | Admitting: Nurse Practitioner

## 2019-07-11 DIAGNOSIS — E785 Hyperlipidemia, unspecified: Secondary | ICD-10-CM

## 2019-07-14 ENCOUNTER — Encounter: Payer: Self-pay | Admitting: Nurse Practitioner

## 2019-07-14 LAB — HM COLONOSCOPY

## 2019-07-19 ENCOUNTER — Encounter: Payer: Self-pay | Admitting: Nurse Practitioner

## 2019-07-20 ENCOUNTER — Encounter: Payer: Self-pay | Admitting: Nurse Practitioner

## 2019-07-21 ENCOUNTER — Other Ambulatory Visit: Payer: Self-pay

## 2019-07-21 ENCOUNTER — Ambulatory Visit: Payer: 59 | Admitting: Nurse Practitioner

## 2019-07-21 ENCOUNTER — Encounter: Payer: Self-pay | Admitting: Nurse Practitioner

## 2019-07-21 VITALS — BP 122/82 | HR 86 | Temp 98.3°F | Ht 62.6 in | Wt 183.8 lb

## 2019-07-21 DIAGNOSIS — J302 Other seasonal allergic rhinitis: Secondary | ICD-10-CM | POA: Insufficient documentation

## 2019-07-21 DIAGNOSIS — R42 Dizziness and giddiness: Secondary | ICD-10-CM | POA: Diagnosis not present

## 2019-07-21 MED ORDER — MECLIZINE HCL 12.5 MG PO TABS: 12.5000 mg | ORAL_TABLET | Freq: Three times a day (TID) | ORAL | 0 refills | Status: DC | PRN

## 2019-07-21 NOTE — Progress Notes (Signed)
This visit occurred during the SARS-CoV-2 public health emergency.  Safety protocols were in place, including screening questions prior to the visit, additional usage of staff PPE, and extensive cleaning of exam room while observing appropriate contact time as indicated for disinfecting solutions.  Subjective:     Patient ID: Margaret Hill , female    DOB: 04/14/1956 , 64 y.o.   MRN: [id number]   Chief Complaint  Patient presents with  . Dizziness    patient stated she started feeling dizzy on monday. patient stated she had some ear pain last week but it has gone away    HPI  She had sat up on the side of the bed and felt the room spinning for about 10 minutes. She had to support herself in the shower. Would come and go throughout.   Dizziness This is a new problem. The current episode started in the past 7 days (Started on Monday). The problem occurs intermittently. The problem has been gradually improving. Associated symptoms include coughing. Pertinent negatives include no chills, congestion, fatigue, fever, headaches or sore throat. Associated symptoms comments: Rhinorrhea - taking allergy medication She did a lot of coughing last week Denies ear pain (left). .     Past Medical History:  Diagnosis Date  . Fibromyalgia   . Hypercholesteremia   . Hypertension      Family History  Family history unknown: Yes     Current Outpatient Medications:  .  Albuterol Sulfate 108 (90 Base) MCG/ACT AEPB, Inhale 2 puffs into the lungs every 6 (six) hours as needed (for shortness of breath or cough)., Disp: 1 each, Rfl: 1 .  Famotidine 20 MG CHEW, Chew 10 mg by mouth daily., Disp: , Rfl:  .  fluocinonide ointment (LIDEX) AB-123456789 %, Apply 1 application topically 2 (two) times daily., Disp: 30 g, Rfl: 1 .  LIVALO 4 MG TABS, TAKE 1 TABLET(4 MG) BY MOUTH DAILY, Disp: 90 tablet, Rfl: 1 .  losartan-hydrochlorothiazide (HYZAAR) 50-12.5 MG tablet, TAKE 1 TABLET BY MOUTH DAILY, Disp: 90 tablet,  Rfl: 1 .  valACYclovir (VALTREX) 500 MG tablet, Take 500 mg by mouth 2 (two) times daily., Disp: , Rfl:  .  Vitamin D, Ergocalciferol, (DRISDOL) 1.25 MG (50000 UT) CAPS capsule, Take 1 capsule (50,000 Units total) by mouth 2 (two) times a week., Disp: 24 capsule, Rfl: 1   Allergies  Allergen Reactions  . Aspirin     REACTION: stomach pain  . Ceftriaxone Sodium   . Codeine     REACTION: shortness of breath  . Doxycycline     REACTION: hives/skin rash  . Penicillins     REACTION: hives/skin rash  . Simvastatin     REACTION: muscular pain  . Sulfonamide Derivatives     REACTION: hives/skin rash  . Trimethoprim     REACTION: Hives/skin rash     Review of Systems  Constitutional: Negative for chills, fatigue and fever.  HENT: Negative for congestion and sore throat.   Respiratory: Positive for cough.   Neurological: Positive for dizziness. Negative for headaches.     Today's Vitals   07/21/19 1423 07/21/19 1426 07/21/19 1427  BP: 122/80 124/80 122/82  Pulse: 82 80 86  Temp: 98.3 F (36.8 C)    TempSrc: Oral    Weight: 183 lb 12.8 oz (83.4 kg)    Height: 5' 2.6" (1.59 m)    PainSc: 0-No pain     Body mass index is 32.98 kg/m.   Objective:  Physical Exam  HENT:     Right Ear: Ear canal and external ear normal. There is no impacted cerumen.     Left Ear: Ear canal and external ear normal. There is no impacted cerumen.     Ears:     Comments: TM bulging bilaterally right more than left Cardiovascular:     Rate and Rhythm: Normal rate and regular rhythm.     Pulses: Normal pulses.     Heart sounds: Normal heart sounds. No murmur.  Pulmonary:     Effort: Pulmonary effort is normal. No respiratory distress.     Breath sounds: Normal breath sounds.  Musculoskeletal:        General: No swelling or tenderness. Normal range of motion.  Neurological:     General: No focal deficit present.     Mental Status: She is oriented to person, place, and time.  Psychiatric:         Mood and Affect: Mood normal.        Thought Content: Thought content normal.        Judgment: Judgment normal.         Assessment And Plan:   1. Dizziness  Has TM bulging right more than left  Will try meclizine and flonase   If not better in 1-2 weeks will check labs  2. Seasonal allergies  Take over the counter flonase  Bilateral TM bulging    [name], FNP    THE PATIENT IS ENCOURAGED TO PRACTICE SOCIAL DISTANCING DUE TO THE COVID-19 PANDEMIC.

## 2019-07-21 NOTE — Patient Instructions (Signed)

## 2019-07-30 ENCOUNTER — Encounter: Payer: Self-pay | Admitting: Nurse Practitioner

## 2019-08-09 ENCOUNTER — Other Ambulatory Visit: Payer: Self-pay | Admitting: Internal Medicine

## 2019-08-16 ENCOUNTER — Ambulatory Visit: Payer: 59 | Admitting: Nurse Practitioner

## 2019-08-16 ENCOUNTER — Encounter: Payer: Self-pay | Admitting: Nurse Practitioner

## 2019-08-16 ENCOUNTER — Other Ambulatory Visit: Payer: Self-pay

## 2019-08-16 VITALS — BP 140/92 | HR 85 | Temp 98.6°F | Ht 62.6 in | Wt 185.0 lb

## 2019-08-16 DIAGNOSIS — M797 Fibromyalgia: Secondary | ICD-10-CM | POA: Diagnosis not present

## 2019-08-16 DIAGNOSIS — R21 Rash and other nonspecific skin eruption: Secondary | ICD-10-CM

## 2019-08-16 DIAGNOSIS — R030 Elevated blood-pressure reading, without diagnosis of hypertension: Secondary | ICD-10-CM | POA: Diagnosis not present

## 2019-08-16 MED ORDER — KETOROLAC TROMETHAMINE 60 MG/2ML IM SOLN
60.0000 mg | Freq: Once | INTRAMUSCULAR | Status: AC
  Administered 2019-08-16: 60 mg via INTRAMUSCULAR

## 2019-08-16 MED ORDER — NYSTATIN 100000 UNIT/GM EX CREA: 1.0000 "application " | TOPICAL_CREAM | Freq: Two times a day (BID) | CUTANEOUS | 0 refills | Status: AC

## 2019-08-16 NOTE — Progress Notes (Signed)
Subjective:     Patient ID: Margaret Hill , female    DOB: 1955-08-06 , 64 y.o.   MRN: [id number]   Chief Complaint  Patient presents with  . Fibromyalgia    patient stated she is having a flare up and she is in pain all over.    HPI  She is having fibromyalgia pain all over since the last rain. She is taking ibuprofen at night.  She is unable to exercise due to the pain Wt Readings from Last 3 Encounters: 08/16/19 : 185 lb (83.9 kg) 07/21/19 : 183 lb 12.8 oz (83.4 kg) 06/16/19 : 182 lb (82.6 kg)     Past Medical History:  Diagnosis Date  . Fibromyalgia   . Hypercholesteremia   . Hypertension      Family History  Family history unknown: Yes     Current Outpatient Medications:  .  Albuterol Sulfate 108 (90 Base) MCG/ACT AEPB, Inhale 2 puffs into the lungs every 6 (six) hours as needed (for shortness of breath or cough)., Disp: 1 each, Rfl: 1 .  Famotidine 20 MG CHEW, Chew 10 mg by mouth daily., Disp: , Rfl:  .  fluocinonide ointment (LIDEX) AB-123456789 %, Apply 1 application topically 2 (two) times daily., Disp: 30 g, Rfl: 1 .  ibuprofen (ADVIL) 800 MG tablet, TAKE 1 TABLET(800 MG) BY MOUTH EVERY 8 HOURS AS NEEDED, Disp: 60 tablet, Rfl: 1 .  LIVALO 4 MG TABS, TAKE 1 TABLET(4 MG) BY MOUTH DAILY, Disp: 90 tablet, Rfl: 1 .  losartan-hydrochlorothiazide (HYZAAR) 50-12.5 MG tablet, TAKE 1 TABLET BY MOUTH DAILY, Disp: 90 tablet, Rfl: 1 .  meclizine (ANTIVERT) 12.5 MG tablet, Take 1 tablet (12.5 mg total) by mouth 3 (three) times daily as needed for dizziness., Disp: 30 tablet, Rfl: 0 .  valACYclovir (VALTREX) 500 MG tablet, Take 500 mg by mouth 2 (two) times daily., Disp: , Rfl:  .  Vitamin D, Ergocalciferol, (DRISDOL) 1.25 MG (50000 UT) CAPS capsule, Take 1 capsule (50,000 Units total) by mouth 2 (two) times a week., Disp: 24 capsule, Rfl: 1   Allergies  Allergen Reactions  . Aspirin     REACTION: stomach pain  . Ceftriaxone Sodium   . Codeine     REACTION: shortness of breath   . Doxycycline     REACTION: hives/skin rash  . Penicillins     REACTION: hives/skin rash  . Simvastatin     REACTION: muscular pain  . Sulfonamide Derivatives     REACTION: hives/skin rash  . Trimethoprim     REACTION: Hives/skin rash     Review of Systems  Constitutional: Negative.   Respiratory: Negative.  Negative for cough.   Cardiovascular: Negative.  Negative for chest pain, palpitations and leg swelling.  Endocrine: Negative for polydipsia, polyphagia and polyuria.  Musculoskeletal: Positive for arthralgias, myalgias and neck pain. Negative for back pain.  Skin: Positive for rash (dry scaly hyperpigmented rash to face.).  Neurological: Negative for dizziness and headaches.  Psychiatric/Behavioral: Negative.      Today's Vitals   08/16/19 1558  BP: (!) 140/92  Pulse: 85  Temp: 98.6 F (37 C)  TempSrc: Oral  Weight: 185 lb (83.9 kg)  Height: 5' 2.6" (1.59 m)  PainSc: 10-Worst pain ever   Body mass index is 33.19 kg/m.   Objective:  Physical Exam Constitutional:      Appearance: Normal appearance.  Cardiovascular:     Rate and Rhythm: Normal rate and regular rhythm.     Pulses:  Normal pulses.     Heart sounds: Normal heart sounds. No murmur.  Pulmonary:     Effort: Pulmonary effort is normal. No respiratory distress.     Breath sounds: Normal breath sounds.  Skin:    Capillary Refill: Capillary refill takes less than 2 seconds.  Neurological:     General: No focal deficit present.     Mental Status: She is alert.  Psychiatric:        Mood and Affect: Mood normal.        Thought Content: Thought content normal.        Judgment: Judgment normal.         Assessment And Plan:     1. Fibromyalgia  toradol 60 mg IM given in office  2. Elevated blood pressure, situational  This is likely related to her current pain level  3. Rash and nonspecific skin eruption  Dry, scaly hyperpigmented rash to bilateral cheeks  Will treat with nystatin cream   - nystatin cream (MYCOSTATIN); Apply 1 application topically 2 (two) times daily.  Dispense: 30 g; Refill: 0    [name], FNP    THE PATIENT IS ENCOURAGED TO PRACTICE SOCIAL DISTANCING DUE TO THE COVID-19 PANDEMIC.

## 2019-09-06 ENCOUNTER — Other Ambulatory Visit: Payer: Self-pay | Admitting: Nurse Practitioner

## 2019-09-06 DIAGNOSIS — E559 Vitamin D deficiency, unspecified: Secondary | ICD-10-CM

## 2019-09-09 ENCOUNTER — Other Ambulatory Visit: Payer: Self-pay

## 2019-09-09 DIAGNOSIS — I10 Essential (primary) hypertension: Secondary | ICD-10-CM

## 2019-09-09 MED ORDER — LOSARTAN POTASSIUM-HCTZ 50-12.5 MG PO TABS: 1.0000 | ORAL_TABLET | Freq: Every day | ORAL | 1 refills | Status: DC

## 2019-09-18 ENCOUNTER — Encounter: Payer: Self-pay | Admitting: Nurse Practitioner

## 2019-09-28 ENCOUNTER — Ambulatory Visit: Payer: 59 | Admitting: Nurse Practitioner

## 2019-10-05 ENCOUNTER — Ambulatory Visit: Payer: 59 | Admitting: Nurse Practitioner

## 2019-10-05 ENCOUNTER — Other Ambulatory Visit: Payer: Self-pay

## 2019-10-05 ENCOUNTER — Encounter: Payer: Self-pay | Admitting: Nurse Practitioner

## 2019-10-05 VITALS — BP 120/80 | HR 77 | Temp 98.1°F | Ht 62.6 in | Wt 187.8 lb

## 2019-10-05 DIAGNOSIS — R030 Elevated blood-pressure reading, without diagnosis of hypertension: Secondary | ICD-10-CM

## 2019-10-05 DIAGNOSIS — M2550 Pain in unspecified joint: Secondary | ICD-10-CM | POA: Diagnosis not present

## 2019-10-05 DIAGNOSIS — M79644 Pain in right finger(s): Secondary | ICD-10-CM | POA: Diagnosis not present

## 2019-10-05 MED ORDER — MELOXICAM 7.5 MG PO TABS: 7.5000 mg | ORAL_TABLET | Freq: Every day | ORAL | 2 refills | Status: DC

## 2019-10-05 NOTE — Progress Notes (Signed)
This visit occurred during the SARS-CoV-2 public health emergency.  Safety protocols were in place, including screening questions prior to the visit, additional usage of staff PPE, and extensive cleaning of exam room while observing appropriate contact time as indicated for disinfecting solutions.  Subjective:     Patient ID: Margaret Hill , female    DOB: 10-Jul-1955 , 64 y.o.   MRN: [id number]   Chief Complaint  Patient presents with  . Hypertension    HPI  She is here today for follow up of her blood pressure when she was here recently.  She has been having joint pain since having the covid vaccine. She states "when she eats bread she has an upset stomach".  She has cut her sugar intake back and is drinking black coffee.   Wt Readings from Last 3 Encounters: 10/05/19 : 187 lb 12.8 oz (85.2 kg) 08/16/19 : 185 lb (83.9 kg) 07/21/19 : 183 lb 12.8 oz (83.4 kg)     Past Medical History:  Diagnosis Date  . Fibromyalgia   . Hypercholesteremia   . Hypertension      Family History  Family history unknown: Yes     Current Outpatient Medications:  .  Albuterol Sulfate 108 (90 Base) MCG/ACT AEPB, Inhale 2 puffs into the lungs every 6 (six) hours as needed (for shortness of breath or cough)., Disp: 1 each, Rfl: 1 .  Famotidine 20 MG CHEW, Chew 10 mg by mouth daily., Disp: , Rfl:  .  fluocinonide ointment (LIDEX) 5.78 %, Apply 1 application topically 2 (two) times daily., Disp: 30 g, Rfl: 1 .  ibuprofen (ADVIL) 800 MG tablet, TAKE 1 TABLET(800 MG) BY MOUTH EVERY 8 HOURS AS NEEDED, Disp: 60 tablet, Rfl: 1 .  LIVALO 4 MG TABS, TAKE 1 TABLET(4 MG) BY MOUTH DAILY, Disp: 90 tablet, Rfl: 1 .  losartan-hydrochlorothiazide (HYZAAR) 50-12.5 MG tablet, Take 1 tablet by mouth daily., Disp: 90 tablet, Rfl: 1 .  meclizine (ANTIVERT) 12.5 MG tablet, Take 1 tablet (12.5 mg total) by mouth 3 (three) times daily as needed for dizziness., Disp: 30 tablet, Rfl: 0 .  nystatin cream (MYCOSTATIN), Apply  1 application topically 2 (two) times daily., Disp: 30 g, Rfl: 0 .  valACYclovir (VALTREX) 500 MG tablet, Take 500 mg by mouth 2 (two) times daily., Disp: , Rfl:  .  Vitamin D, Ergocalciferol, (DRISDOL) 1.25 MG (50000 UNIT) CAPS capsule, TAKE 1 CAPSULE BY MOUTH 2 TIMES A WEEK, Disp: 24 capsule, Rfl: 1   Allergies  Allergen Reactions  . Aspirin     REACTION: stomach pain  . Ceftriaxone Sodium   . Codeine     REACTION: shortness of breath  . Doxycycline     REACTION: hives/skin rash  . Penicillins     REACTION: hives/skin rash  . Simvastatin     REACTION: muscular pain  . Sulfonamide Derivatives     REACTION: hives/skin rash  . Trimethoprim     REACTION: Hives/skin rash     Review of Systems  Constitutional: Negative.   Respiratory: Negative.   Cardiovascular: Negative.  Negative for chest pain, palpitations and leg swelling.  Musculoskeletal: Positive for arthralgias (since having her covid vaccine).  Neurological: Negative.  Negative for dizziness and headaches.  Psychiatric/Behavioral: Negative.      Today's Vitals   10/05/19 0825  BP: 120/80  Pulse: 77  Temp: 98.1 F (36.7 C)  TempSrc: Oral  SpO2: 99%  Weight: 187 lb 12.8 oz (85.2 kg)  Height: 5' 2.6" (  1.59 m)  PainSc: 10-Worst pain ever   Body mass index is 33.69 kg/m.   Objective:  Physical Exam Vitals reviewed.  Constitutional:      General: She is not in acute distress.    Appearance: Normal appearance.  Cardiovascular:     Rate and Rhythm: Normal rate and regular rhythm.     Pulses: Normal pulses.     Heart sounds: Normal heart sounds. No murmur heard.   Pulmonary:     Effort: Pulmonary effort is normal. No respiratory distress.     Breath sounds: Normal breath sounds.  Musculoskeletal:     Comments: Right lateral thumb with nodule present non-moveable.  Skin:    General: Skin is warm and dry.     Capillary Refill: Capillary refill takes less than 2 seconds.  Neurological:     General: No  focal deficit present.     Mental Status: She is alert and oriented to person, place, and time.  Psychiatric:        Mood and Affect: Mood normal.        Behavior: Behavior normal.        Thought Content: Thought content normal.        Judgment: Judgment normal.         Assessment And Plan:     1. Elevated blood pressure, situational  Improved this visit.   2. Arthralgia, unspecified joint  She will try meloxicam   Encouraged to avoid gluten as well.  - meloxicam (MOBIC) 7.5 MG tablet; Take 1 tablet (7.5 mg total) by mouth daily.  Dispense: 30 tablet; Refill: 2  3. Pain of right thumb  Has a small nodule present to right lateral thumb, non-moveable  If worsens return to office for further treatment or referral.    [name], FNP    THE PATIENT IS ENCOURAGED TO PRACTICE SOCIAL DISTANCING DUE TO THE COVID-19 PANDEMIC.

## 2019-10-19 ENCOUNTER — Encounter: Payer: Self-pay | Admitting: Nurse Practitioner

## 2019-11-16 ENCOUNTER — Encounter: Payer: Self-pay | Admitting: Nurse Practitioner

## 2019-11-16 ENCOUNTER — Ambulatory Visit (INDEPENDENT_AMBULATORY_CARE_PROVIDER_SITE_OTHER): Payer: 59 | Admitting: Nurse Practitioner

## 2019-11-16 ENCOUNTER — Other Ambulatory Visit: Payer: Self-pay

## 2019-11-16 VITALS — BP 118/80 | HR 71 | Temp 98.1°F | Ht 62.8 in | Wt 187.0 lb

## 2019-11-16 DIAGNOSIS — M2550 Pain in unspecified joint: Secondary | ICD-10-CM | POA: Diagnosis not present

## 2019-11-16 DIAGNOSIS — I10 Essential (primary) hypertension: Secondary | ICD-10-CM | POA: Diagnosis not present

## 2019-11-16 DIAGNOSIS — R7303 Prediabetes: Secondary | ICD-10-CM

## 2019-11-16 NOTE — Progress Notes (Signed)
This visit occurred during the SARS-CoV-2 public health emergency.  Safety protocols were in place, including screening questions prior to the visit, additional usage of staff PPE, and extensive cleaning of exam room while observing appropriate contact time as indicated for disinfecting solutions.  Subjective:     Patient ID: Margaret Hill , female    DOB: May 07, 1955 , 64 y.o.   MRN: [id number]   Chief Complaint  Patient presents with  . med check    patient stated she was put on meloxicam to help with her joint pain she stated she stopped taking it because it wasnt working    HPI  Continues to have pain all over especially today due to the rain.  She stopped taking the meloxicam due to not feeling pain.  She has taken celebrex in the past in a study which caused her an electrical sensation across her head.  She has tried Lyrica in the past and had side effects.      Past Medical History:  Diagnosis Date  . Fibromyalgia   . Hypercholesteremia   . Hypertension      Family History  Family history unknown: Yes     Current Outpatient Medications:  .  Albuterol Sulfate 108 (90 Base) MCG/ACT AEPB, Inhale 2 puffs into the lungs every 6 (six) hours as needed (for shortness of breath or cough)., Disp: 1 each, Rfl: 1 .  fluocinonide ointment (LIDEX) 3.41 %, Apply 1 application topically 2 (two) times daily., Disp: 30 g, Rfl: 1 .  ibuprofen (ADVIL) 800 MG tablet, TAKE 1 TABLET(800 MG) BY MOUTH EVERY 8 HOURS AS NEEDED, Disp: 60 tablet, Rfl: 1 .  LIVALO 4 MG TABS, TAKE 1 TABLET(4 MG) BY MOUTH DAILY, Disp: 90 tablet, Rfl: 1 .  losartan-hydrochlorothiazide (HYZAAR) 50-12.5 MG tablet, Take 1 tablet by mouth daily., Disp: 90 tablet, Rfl: 1 .  meclizine (ANTIVERT) 12.5 MG tablet, Take 1 tablet (12.5 mg total) by mouth 3 (three) times daily as needed for dizziness., Disp: 30 tablet, Rfl: 0 .  nystatin cream (MYCOSTATIN), Apply 1 application topically 2 (two) times daily., Disp: 30 g, Rfl: 0 .   valACYclovir (VALTREX) 500 MG tablet, Take 500 mg by mouth 2 (two) times daily., Disp: , Rfl:  .  Vitamin D, Ergocalciferol, (DRISDOL) 1.25 MG (50000 UNIT) CAPS capsule, TAKE 1 CAPSULE BY MOUTH 2 TIMES A WEEK, Disp: 24 capsule, Rfl: 1 .  Famotidine 20 MG CHEW, Chew 10 mg by mouth daily. (Patient not taking: Reported on 11/15/2019), Disp: , Rfl:  .  meloxicam (MOBIC) 7.5 MG tablet, Take 1 tablet (7.5 mg total) by mouth daily. (Patient not taking: Reported on 11/15/2019), Disp: 30 tablet, Rfl: 2   Allergies  Allergen Reactions  . Aspirin     REACTION: stomach pain  . Ceftriaxone Sodium   . Codeine     REACTION: shortness of breath  . Doxycycline     REACTION: hives/skin rash  . Penicillins     REACTION: hives/skin rash  . Simvastatin     REACTION: muscular pain  . Sulfonamide Derivatives     REACTION: hives/skin rash  . Trimethoprim     REACTION: Hives/skin rash     Review of Systems  Constitutional: Negative.  Negative for fatigue.  Respiratory: Negative.   Cardiovascular: Negative.   Gastrointestinal: Negative.   Musculoskeletal: Positive for arthralgias.  Neurological: Negative for dizziness and headaches.  Psychiatric/Behavioral: Negative.      Today's Vitals   11/16/19 0942  BP: 118/80  Pulse: 71  Temp: 98.1 F (36.7 C)  TempSrc: Oral  Weight: 187 lb (84.8 kg)  Height: 5' 2.8" (1.595 m)  PainSc: 9    Body mass index is 33.34 kg/m.   Objective:  Physical Exam Constitutional:      General: She is not in acute distress.    Appearance: Normal appearance. She is obese.  Cardiovascular:     Rate and Rhythm: Normal rate and regular rhythm.     Pulses: Normal pulses.     Heart sounds: Normal heart sounds. No murmur heard.   Pulmonary:     Effort: Pulmonary effort is normal. No respiratory distress.     Breath sounds: Normal breath sounds.  Musculoskeletal:        General: No swelling or tenderness. Normal range of motion.  Skin:    General: Skin is warm and dry.   Neurological:     General: No focal deficit present.     Mental Status: She is alert and oriented to person, place, and time.  Psychiatric:        Mood and Affect: Mood normal.        Behavior: Behavior normal.        Thought Content: Thought content normal.        Judgment: Judgment normal.         Assessment And Plan:     1. Arthralgia, unspecified joint  She did not do well with meloxicam did not feel was effective  Encouraged to eat a low gluten diet and given information on autoimmune diet  Encouraged to exercise at least 150 minutes per week.  2. Prediabetes  Chronic, will check HgbA1c today  No current medications - Hemoglobin A1c - BMP8+eGFR  3. HYPERTENSION, BENIGN ESSENTIAL  Chronic, blood pressure is well controlled  Will check kidney functions  Eat healthy diet low in salt. - BMP8+eGFR     Patient was given opportunity to ask questions. Patient verbalized understanding of the plan and was able to repeat key elements of the plan. All questions were answered to their satisfaction.  [name], FNP   I, [name], FNP, have reviewed all documentation for this visit. The documentation on 11/16/19 for the exam, diagnosis, procedures, and orders are all accurate and complete.  THE PATIENT IS ENCOURAGED TO PRACTICE SOCIAL DISTANCING DUE TO THE COVID-19 PANDEMIC.

## 2019-11-16 NOTE — Patient Instructions (Addendum)
Foods to help with limiting fibromyalgia flares  Foods That Are Allowed and Disallowed on the Autoimmune Protocol Diet  The Autoimmune diet allows you to eat:  Meat (preferably grass-fed) and fish Vegetables, excluding nightshade vegetables Sweet potatoes Fruit in small quantities Coconut milk Avocado, olive, and coconut oil Dairy-free fermented foods (such as kombucha, sauerkraut, kefir made with coconut milk, or kimchi) Honey or maple syrup in small quantities Fresh nonseed herbs (such as basil, mint, or oregano) Green tea, and nonseed herbal teas Bone broth Vinegars Grass-fed gelatin and arrowroot starch  The Autoimmune diet does not allow you to eat:  All grains (including oats, wheat, and rice) All dairy Eggs Nuts and seeds Legumes and beans Nightshade vegetables (tomatoes, potatoes, eggplant, peppers) All sugars, including alternative sugars, such as stevia and xylitol Butter and ghee (clarified butter) Oils (other than coconut oil, olive oil, and avocado oil, which are allowed) Herbs derived from seeds Food additives or processed foods Chocolate Alcohol

## 2019-11-17 LAB — BMP8+EGFR
BUN/Creatinine Ratio: 15 (ref 12–28)
BUN: 12 mg/dL (ref 8–27)
CO2: 22 mmol/L (ref 20–29)
Calcium: 9.3 mg/dL (ref 8.7–10.3)
Chloride: 101 mmol/L (ref 96–106)
Creatinine, Ser: 0.8 mg/dL (ref 0.57–1.00)
GFR calc Af Amer: 91 mL/min/{1.73_m2} (ref >59)
GFR calc non Af Amer: 79 mL/min/{1.73_m2} (ref >59)
Glucose: 86 mg/dL (ref 65–99)
Potassium: 4.4 mmol/L (ref 3.5–5.2)
Sodium: 138 mmol/L (ref 134–144)

## 2019-11-17 LAB — HEMOGLOBIN A1C
Est. average glucose Bld gHb Est-mCnc: 128 mg/dL
Hgb A1c MFr Bld: 6.1 % — ABNORMAL HIGH (ref 4.8–5.6)

## 2020-01-06 ENCOUNTER — Encounter: Payer: Self-pay | Admitting: Nurse Practitioner

## 2020-01-28 ENCOUNTER — Encounter: Payer: Self-pay | Admitting: Nurse Practitioner

## 2020-02-15 ENCOUNTER — Other Ambulatory Visit: Payer: Self-pay | Admitting: Internal Medicine

## 2020-02-15 DIAGNOSIS — Z1231 Encounter for screening mammogram for malignant neoplasm of breast: Secondary | ICD-10-CM

## 2020-02-20 ENCOUNTER — Other Ambulatory Visit: Payer: Self-pay | Admitting: Nurse Practitioner

## 2020-02-20 DIAGNOSIS — E559 Vitamin D deficiency, unspecified: Secondary | ICD-10-CM

## 2020-02-25 ENCOUNTER — Ambulatory Visit
Admission: RE | Admit: 2020-02-25 | Discharge: 2020-02-25 | Disposition: A | Discharge status: Home / Self Care | Payer: 59 | Source: Ambulatory Visit

## 2020-02-25 ENCOUNTER — Other Ambulatory Visit: Payer: Self-pay

## 2020-02-25 DIAGNOSIS — Z1231 Encounter for screening mammogram for malignant neoplasm of breast: Secondary | ICD-10-CM

## 2020-03-01 ENCOUNTER — Other Ambulatory Visit: Payer: Self-pay | Admitting: Internal Medicine

## 2020-03-01 DIAGNOSIS — R928 Other abnormal and inconclusive findings on diagnostic imaging of breast: Secondary | ICD-10-CM

## 2020-03-07 ENCOUNTER — Other Ambulatory Visit: Payer: Self-pay | Admitting: Nurse Practitioner

## 2020-03-07 ENCOUNTER — Other Ambulatory Visit: Payer: Self-pay | Admitting: Internal Medicine

## 2020-03-07 DIAGNOSIS — I10 Essential (primary) hypertension: Secondary | ICD-10-CM

## 2020-03-07 DIAGNOSIS — E785 Hyperlipidemia, unspecified: Secondary | ICD-10-CM

## 2020-03-18 ENCOUNTER — Ambulatory Visit: Payer: 59

## 2020-03-18 ENCOUNTER — Ambulatory Visit
Admission: RE | Admit: 2020-03-18 | Discharge: 2020-03-18 | Disposition: A | Discharge status: Home / Self Care | Payer: 59 | Source: Ambulatory Visit | Attending: Internal Medicine | Admitting: Internal Medicine

## 2020-03-18 ENCOUNTER — Other Ambulatory Visit: Payer: Self-pay

## 2020-03-18 DIAGNOSIS — R928 Other abnormal and inconclusive findings on diagnostic imaging of breast: Secondary | ICD-10-CM

## 2020-03-28 ENCOUNTER — Encounter: Payer: Self-pay | Admitting: Nurse Practitioner

## 2020-03-28 ENCOUNTER — Other Ambulatory Visit: Payer: Self-pay

## 2020-03-28 DIAGNOSIS — E785 Hyperlipidemia, unspecified: Secondary | ICD-10-CM

## 2020-03-28 MED ORDER — LIVALO 4 MG PO TABS: ORAL_TABLET | ORAL | 1 refills | Status: DC

## 2020-03-28 MED ORDER — VALACYCLOVIR HCL 500 MG PO TABS
500.0000 mg | ORAL_TABLET | Freq: Two times a day (BID) | ORAL | 1 refills | Status: DC
Start: 2020-03-28 — End: 2022-03-20

## 2020-03-30 ENCOUNTER — Other Ambulatory Visit: Payer: Self-pay

## 2020-03-30 ENCOUNTER — Ambulatory Visit: Payer: 59 | Admitting: Nurse Practitioner

## 2020-03-30 ENCOUNTER — Encounter: Payer: Self-pay | Admitting: Nurse Practitioner

## 2020-03-30 VITALS — BP 118/76 | HR 80 | Temp 98.2°F | Ht 62.8 in | Wt 185.6 lb

## 2020-03-30 DIAGNOSIS — R7303 Prediabetes: Secondary | ICD-10-CM | POA: Diagnosis not present

## 2020-03-30 DIAGNOSIS — I10 Essential (primary) hypertension: Secondary | ICD-10-CM | POA: Diagnosis not present

## 2020-03-30 DIAGNOSIS — H9202 Otalgia, left ear: Secondary | ICD-10-CM | POA: Diagnosis not present

## 2020-03-30 DIAGNOSIS — Z0000 Encounter for general adult medical examination without abnormal findings: Secondary | ICD-10-CM

## 2020-03-30 DIAGNOSIS — E785 Hyperlipidemia, unspecified: Secondary | ICD-10-CM

## 2020-03-30 DIAGNOSIS — E559 Vitamin D deficiency, unspecified: Secondary | ICD-10-CM

## 2020-03-30 NOTE — Patient Instructions (Signed)
Health Maintenance, Female Adopting a healthy lifestyle and getting preventive care are important in promoting health and wellness. Ask your health care provider about:  The right schedule for you to have regular tests and exams.  Things you can do on your own to prevent diseases and keep yourself healthy. What should I know about diet, weight, and exercise? Eat a healthy diet   Eat a diet that includes plenty of vegetables, fruits, low-fat dairy products, and lean protein.  Do not eat a lot of foods that are high in solid fats, added sugars, or sodium. Maintain a healthy weight Body mass index (BMI) is used to identify weight problems. It estimates body fat based on height and weight. Your health care provider can help determine your BMI and help you achieve or maintain a healthy weight. Get regular exercise Get regular exercise. This is one of the most important things you can do for your health. Most adults should:  Exercise for at least 150 minutes each week. The exercise should increase your heart rate and make you sweat (moderate-intensity exercise).  Do strengthening exercises at least twice a week. This is in addition to the moderate-intensity exercise.  Spend less time sitting. Even light physical activity can be beneficial. Watch cholesterol and blood lipids Have your blood tested for lipids and cholesterol at 64 years of age, then have this test every 5 years. Have your cholesterol levels checked more often if:  Your lipid or cholesterol levels are high.  You are older than 64 years of age.  You are at high risk for heart disease. What should I know about cancer screening? Depending on your health history and family history, you may need to have cancer screening at various ages. This may include screening for:  Breast cancer.  Cervical cancer.  Colorectal cancer.  Skin cancer.  Lung cancer. What should I know about heart disease, diabetes, and high blood  pressure? Blood pressure and heart disease  High blood pressure causes heart disease and increases the risk of stroke. This is more likely to develop in people who have high blood pressure readings, are of African descent, or are overweight.  Have your blood pressure checked: ? Every 3-5 years if you are 18-39 years of age. ? Every year if you are 40 years old or older. Diabetes Have regular diabetes screenings. This checks your fasting blood sugar level. Have the screening done:  Once every three years after age 40 if you are at a normal weight and have a low risk for diabetes.  More often and at a younger age if you are overweight or have a high risk for diabetes. What should I know about preventing infection? Hepatitis B If you have a higher risk for hepatitis B, you should be screened for this virus. Talk with your health care provider to find out if you are at risk for hepatitis B infection. Hepatitis C Testing is recommended for:  Everyone born from 1945 through 1965.  Anyone with known risk factors for hepatitis C. Sexually transmitted infections (STIs)  Get screened for STIs, including gonorrhea and chlamydia, if: ? You are sexually active and are younger than 64 years of age. ? You are older than 64 years of age and your health care provider tells you that you are at risk for this type of infection. ? Your sexual activity has changed since you were last screened, and you are at increased risk for chlamydia or gonorrhea. Ask your health care provider if   you are at risk.  Ask your health care provider about whether you are at high risk for HIV. Your health care provider may recommend a prescription medicine to help prevent HIV infection. If you choose to take medicine to prevent HIV, you should first get tested for HIV. You should then be tested every 3 months for as long as you are taking the medicine. Pregnancy  If you are about to stop having your period (premenopausal) and  you may become pregnant, seek counseling before you get pregnant.  Take 400 to 800 micrograms (mcg) of folic acid every day if you become pregnant.  Ask for birth control (contraception) if you want to prevent pregnancy. Osteoporosis and menopause Osteoporosis is a disease in which the bones lose minerals and strength with aging. This can result in bone fractures. If you are 65 years old or older, or if you are at risk for osteoporosis and fractures, ask your health care provider if you should:  Be screened for bone loss.  Take a calcium or vitamin D supplement to lower your risk of fractures.  Be given hormone replacement therapy (HRT) to treat symptoms of menopause. Follow these instructions at home: Lifestyle  Do not use any products that contain nicotine or tobacco, such as cigarettes, e-cigarettes, and chewing tobacco. If you need help quitting, ask your health care provider.  Do not use street drugs.  Do not share needles.  Ask your health care provider for help if you need support or information about quitting drugs. Alcohol use  Do not drink alcohol if: ? Your health care provider tells you not to drink. ? You are pregnant, may be pregnant, or are planning to become pregnant.  If you drink alcohol: ? Limit how much you use to 0-1 drink a day. ? Limit intake if you are breastfeeding.  Be aware of how much alcohol is in your drink. In the [place] one drink equals one 12 oz bottle of beer (355 mL), one 5 oz glass of wine (148 mL), or one 1 oz glass of hard liquor (44 mL). General instructions  Schedule regular health, dental, and eye exams.  Stay current with your vaccines.  Tell your health care provider if: ? You often feel depressed. ? You have ever been abused or do not feel safe at home. Summary  Adopting a healthy lifestyle and getting preventive care are important in promoting health and wellness.  Follow your health care provider's instructions about healthy  diet, exercising, and getting tested or screened for diseases.  Follow your health care provider's instructions on monitoring your cholesterol and blood pressure. This information is not intended to replace advice given to you by your health care provider. Make sure you discuss any questions you have with your health care provider. Document Revised: 03/25/2018 Document Reviewed: 03/25/2018 Elsevier Patient Education  2020 [employer]  

## 2020-03-30 NOTE — Progress Notes (Signed)
This visit occurred during the SARS-CoV-2 public health emergency.  Safety protocols were in place, including screening questions prior to the visit, additional usage of staff PPE, and extensive cleaning of exam room while observing appropriate contact time as indicated for disinfecting solutions.  Subjective:     Patient ID: Margaret Hill , female    DOB: 19-Jul-1955 , 64 y.o.   MRN: [id number]   Chief Complaint  Patient presents with  . Annual Exam    HPI  Here for hm.    Past Medical History:  Diagnosis Date  . Fibromyalgia   . Hypercholesteremia   . Hypertension      Family History  Family history unknown: Yes     Current Outpatient Medications:  .  Albuterol Sulfate 108 (90 Base) MCG/ACT AEPB, Inhale 2 puffs into the lungs every 6 (six) hours as needed (for shortness of breath or cough)., Disp: 1 each, Rfl: 1 .  Famotidine 20 MG CHEW, Chew 10 mg by mouth daily., Disp: , Rfl:  .  fluocinonide ointment (LIDEX) 4.09 %, Apply 1 application topically 2 (two) times daily., Disp: 30 g, Rfl: 1 .  ibuprofen (ADVIL) 800 MG tablet, TAKE 1 TABLET(800 MG) BY MOUTH EVERY 8 HOURS AS NEEDED, Disp: 60 tablet, Rfl: 1 .  losartan-hydrochlorothiazide (HYZAAR) 50-12.5 MG tablet, TAKE 1 TABLET BY MOUTH DAILY, Disp: 90 tablet, Rfl: 1 .  nystatin cream (MYCOSTATIN), Apply 1 application topically 2 (two) times daily., Disp: 30 g, Rfl: 0 .  Pitavastatin Calcium (LIVALO) 4 MG TABS, TAKE 1 TABLET(4 MG) BY MOUTH EVERY OTHER DAY, Disp: 90 tablet, Rfl: 1 .  valACYclovir (VALTREX) 500 MG tablet, Take 1 tablet (500 mg total) by mouth 2 (two) times daily., Disp: 90 tablet, Rfl: 1 .  Vitamin D, Ergocalciferol, (DRISDOL) 1.25 MG (50000 UNIT) CAPS capsule, TAKE 1 CAPSULE BY MOUTH 2 TIMES A WEEK, Disp: 24 capsule, Rfl: 1   Allergies  Allergen Reactions  . Aspirin     REACTION: stomach pain  . Ceftriaxone Sodium   . Codeine     REACTION: shortness of breath  . Doxycycline     REACTION: hives/skin  rash  . Penicillins     REACTION: hives/skin rash  . Simvastatin     REACTION: muscular pain  . Sulfonamide Derivatives     REACTION: hives/skin rash  . Trimethoprim     REACTION: Hives/skin rash      The patient states she uses status post hysterectomy for birth control. She continues to have her Paps done by Dr. Garwin Brothers.  Negative for: breast discharge, breast lump(s), breast pain and breast self exam. Associated symptoms include abnormal vaginal bleeding. Pertinent negatives include abnormal bleeding (hematology), anxiety, decreased libido, depression, difficulty falling sleep, dyspareunia, history of infertility, nocturia, sexual dysfunction, sleep disturbances, urinary incontinence, urinary urgency, vaginal discharge and vaginal itching. Diet regular; she is trying to avoid gluten, more vegetables than protein. The patient states her exercise level is minimal, she had been sick for about a week during Thanksgiving. 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   The patient's tobacco use is:  Social History   Tobacco Use  Smoking Status Never Smoker  Smokeless Tobacco Never Used   She has been exposed to passive smoke. The patient's alcohol use is:  Social History   Substance and Sexual Activity  Alcohol Use Yes    Review of Systems  Constitutional: Negative.   HENT: Positive for ear pain (intermittent ear pain to left ear).  Eyes: Negative.   Respiratory: Negative.   Cardiovascular: Negative.   Gastrointestinal: Negative.   Endocrine: Negative.   Genitourinary: Negative.   Musculoskeletal: Negative.   Skin: Negative.   Allergic/Immunologic: Negative.   Neurological: Negative.   Hematological: Negative.   Psychiatric/Behavioral: Negative.      Today's Vitals   03/30/20 0903  BP: 118/76  Pulse: 80  Temp: 98.2 F (36.8 C)  TempSrc: Oral  Weight: 185 lb 9.6 oz (84.2 kg)  Height: 5' 2.8" (1.595 m)  PainSc: 0-No pain   Body mass index is 33.09 kg/m.   Objective:   Physical Exam Constitutional:      General: She is not in acute distress.    Appearance: Normal appearance. She is well-developed. She is obese.  HENT:     Head: Normocephalic and atraumatic.     Right Ear: Hearing, ear canal and external ear normal. There is no impacted cerumen. Tympanic membrane is bulging.     Left Ear: Hearing, ear canal and external ear normal. There is no impacted cerumen. Tympanic membrane is bulging.     Nose:     Comments: Deferred - masked    Mouth/Throat:     Comments: Deferred - masked Eyes:     General: Lids are normal.     Extraocular Movements: Extraocular movements intact.     Conjunctiva/sclera: Conjunctivae normal.     Pupils: Pupils are equal, round, and reactive to light.     Funduscopic exam:    Right eye: No papilledema.        Left eye: No papilledema.  Neck:     Thyroid: No thyroid mass.     Vascular: No carotid bruit.  Cardiovascular:     Rate and Rhythm: Normal rate and regular rhythm.     Pulses: Normal pulses.     Heart sounds: Normal heart sounds. No murmur heard.   Pulmonary:     Effort: Pulmonary effort is normal.     Breath sounds: Normal breath sounds.  Chest:     Chest wall: No mass.  Breasts:     Tanner Score is 5.     Right: Normal. No mass, tenderness, axillary adenopathy or supraclavicular adenopathy.     Left: Normal. No mass, tenderness, axillary adenopathy or supraclavicular adenopathy.    Abdominal:     General: Abdomen is flat. Bowel sounds are normal. There is no distension.     Palpations: Abdomen is soft.     Tenderness: There is no abdominal tenderness.  Genitourinary:    Rectum: Guaiac result negative.  Musculoskeletal:        General: No swelling. Normal range of motion.     Cervical back: Full passive range of motion without pain, normal range of motion and neck supple.     Right lower leg: No edema.     Left lower leg: No edema.  Lymphadenopathy:     Upper Body:     Right upper body: No  supraclavicular, axillary or pectoral adenopathy.     Left upper body: No supraclavicular, axillary or pectoral adenopathy.  Skin:    General: Skin is warm and dry.     Capillary Refill: Capillary refill takes less than 2 seconds.  Neurological:     General: No focal deficit present.     Mental Status: She is alert and oriented to person, place, and time.     Cranial Nerves: No cranial nerve deficit.     Sensory: No sensory deficit.  Psychiatric:  Mood and Affect: Mood normal.        Behavior: Behavior normal.        Thought Content: Thought content normal.        Judgment: Judgment normal.         Assessment And Plan:     1. Encounter for general adult medical examination w/o abnormal findings . Behavior modifications discussed and diet history reviewed.   . Pt will continue to exercise regularly and modify diet with low GI, plant based foods and decrease intake of processed foods.  . Recommend intake of daily multivitamin, Vitamin D, and calcium.  . Recommend mammogram and colonoscopy are both up to date for preventive screenings, as well as recommend immunizations that include influenza, TDAP - CMP14+EGFR - CBC  2. Prediabetes Chronic, stable Continue with current medications Encouraged to limit intake of sugary foods and drinks Encouraged to increase physical activity to 150 minutes per week - Hemoglobin A1c  3. Vitamin D deficiency  Will check vitamin D level and supplement as needed.     Also encouraged to spend 15 minutes in the sun daily.  - VITAMIN D 25 Hydroxy (Vit-D Deficiency, Fractures)  4. Hyperlipidemia, unspecified hyperlipidemia type  Chronic, controlled  No current medications, diet controlled. - Lipid panel  5. Otalgia of left ear  She has bulging TMs, encouraged to take her allegra more regularly and see if this helps her ear pain.   If not better will refer to ENT for further evaluation  6. Essential hypertension  Chronic, excellent  control  Continue with current medications  EKG done with nonspecific T abnormality    Patient was given opportunity to ask questions. Patient verbalized understanding of the plan and was able to repeat key elements of the plan. All questions were answered to their satisfaction.    Teola Bradley, FNP, have reviewed all documentation for this visit. The documentation on 03/30/20 for the exam, diagnosis, procedures, and orders are all accurate and complete.  THE PATIENT IS ENCOURAGED TO PRACTICE SOCIAL DISTANCING DUE TO THE COVID-19 PANDEMIC.

## 2020-03-31 LAB — LIPID PANEL
Chol/HDL Ratio: 2.5 ratio (ref 0.0–4.4)
Cholesterol, Total: 178 mg/dL (ref 100–199)
HDL: 71 mg/dL (ref >39)
LDL Chol Calc (NIH): 97 mg/dL (ref 0–99)
Triglycerides: 52 mg/dL (ref 0–149)
VLDL Cholesterol Cal: 10 mg/dL (ref 5–40)

## 2020-03-31 LAB — CMP14+EGFR
ALT: 14 IU/L (ref 0–32)
AST: 14 IU/L (ref 0–40)
Albumin/Globulin Ratio: 1.7 (ref 1.2–2.2)
Albumin: 4.3 g/dL (ref 3.8–4.8)
Alkaline Phosphatase: 59 IU/L (ref 44–121)
BUN/Creatinine Ratio: 19 (ref 12–28)
BUN: 14 mg/dL (ref 8–27)
Bilirubin Total: 0.3 mg/dL (ref 0.0–1.2)
CO2: 24 mmol/L (ref 20–29)
Calcium: 9.2 mg/dL (ref 8.7–10.3)
Chloride: 105 mmol/L (ref 96–106)
Creatinine, Ser: 0.72 mg/dL (ref 0.57–1.00)
GFR calc Af Amer: 102 mL/min/{1.73_m2} (ref >59)
GFR calc non Af Amer: 89 mL/min/{1.73_m2} (ref >59)
Globulin, Total: 2.5 g/dL (ref 1.5–4.5)
Glucose: 87 mg/dL (ref 65–99)
Potassium: 4.5 mmol/L (ref 3.5–5.2)
Sodium: 141 mmol/L (ref 134–144)
Total Protein: 6.8 g/dL (ref 6.0–8.5)

## 2020-03-31 LAB — CBC
Hematocrit: 36 % (ref 34.0–46.6)
Hemoglobin: 11.4 g/dL (ref 11.1–15.9)
MCH: 26.8 pg (ref 26.6–33.0)
MCHC: 31.7 g/dL (ref 31.5–35.7)
MCV: 85 fL (ref 79–97)
Platelets: 492 10*3/uL — ABNORMAL HIGH (ref 150–450)
RBC: 4.25 x10E6/uL (ref 3.77–5.28)
RDW: 12.9 % (ref 11.7–15.4)
WBC: 4.9 10*3/uL (ref 3.4–10.8)

## 2020-03-31 LAB — VITAMIN D 25 HYDROXY (VIT D DEFICIENCY, FRACTURES): Vit D, 25-Hydroxy: 68.5 ng/mL (ref 30.0–100.0)

## 2020-03-31 LAB — HEMOGLOBIN A1C
Est. average glucose Bld gHb Est-mCnc: 134 mg/dL
Hgb A1c MFr Bld: 6.3 % — ABNORMAL HIGH (ref 4.8–5.6)

## 2020-05-18 ENCOUNTER — Encounter: Payer: Self-pay | Admitting: Nurse Practitioner

## 2020-06-26 ENCOUNTER — Other Ambulatory Visit: Payer: Self-pay | Admitting: Nurse Practitioner

## 2020-08-03 ENCOUNTER — Other Ambulatory Visit: Payer: Self-pay | Admitting: Nurse Practitioner

## 2020-08-03 DIAGNOSIS — E559 Vitamin D deficiency, unspecified: Secondary | ICD-10-CM

## 2020-09-08 ENCOUNTER — Other Ambulatory Visit: Payer: Self-pay | Admitting: Nurse Practitioner

## 2020-09-08 DIAGNOSIS — I10 Essential (primary) hypertension: Secondary | ICD-10-CM

## 2020-09-28 ENCOUNTER — Ambulatory Visit: Payer: 59 | Admitting: Nurse Practitioner

## 2020-10-10 ENCOUNTER — Encounter: Payer: Self-pay | Admitting: Nurse Practitioner

## 2020-10-10 ENCOUNTER — Ambulatory Visit: Payer: 59 | Admitting: Nurse Practitioner

## 2020-10-10 ENCOUNTER — Other Ambulatory Visit: Payer: Self-pay

## 2020-10-10 VITALS — BP 124/80 | HR 84 | Temp 98.1°F | Ht 62.6 in | Wt 197.6 lb

## 2020-10-10 DIAGNOSIS — I10 Essential (primary) hypertension: Secondary | ICD-10-CM | POA: Diagnosis not present

## 2020-10-10 DIAGNOSIS — E559 Vitamin D deficiency, unspecified: Secondary | ICD-10-CM

## 2020-10-10 DIAGNOSIS — E785 Hyperlipidemia, unspecified: Secondary | ICD-10-CM

## 2020-10-10 DIAGNOSIS — R7303 Prediabetes: Secondary | ICD-10-CM | POA: Diagnosis not present

## 2020-10-10 DIAGNOSIS — D229 Melanocytic nevi, unspecified: Secondary | ICD-10-CM

## 2020-10-10 DIAGNOSIS — Z6835 Body mass index (BMI) 35.0-35.9, adult: Secondary | ICD-10-CM

## 2020-10-10 DIAGNOSIS — F331 Major depressive disorder, recurrent, moderate: Secondary | ICD-10-CM

## 2020-10-10 DIAGNOSIS — M797 Fibromyalgia: Secondary | ICD-10-CM

## 2020-10-10 DIAGNOSIS — E6609 Other obesity due to excess calories: Secondary | ICD-10-CM

## 2020-10-10 DIAGNOSIS — Z23 Encounter for immunization: Secondary | ICD-10-CM

## 2020-10-10 MED ORDER — SHINGRIX 50 MCG/0.5ML IM SUSR: 0.5000 mL | Freq: Once | INTRAMUSCULAR | 0 refills | Status: AC

## 2020-10-10 MED ORDER — KETOROLAC TROMETHAMINE 60 MG/2ML IM SOLN
60.0000 mg | Freq: Once | INTRAMUSCULAR | Status: AC
  Administered 2020-10-10: 60 mg via INTRAMUSCULAR

## 2020-10-10 MED ORDER — BUPROPION HCL ER (XL) 150 MG PO TB24: 150.0000 mg | ORAL_TABLET | ORAL | 2 refills | Status: DC

## 2020-10-10 NOTE — Patient Instructions (Addendum)
Hypertension, Adult Hypertension is another name for high blood pressure. High blood pressure forces your heart to work harder to pump blood. This can cause problems overtime. There are two numbers in a blood pressure reading. There is a top number (systolic) over a bottom number (diastolic). It is best to have a blood pressure that is below 120/80. Healthy choicescan help lower your blood pressure, or you may need medicine to help lower it. What are the causes? The cause of this condition is not known. Some conditions may be related tohigh blood pressure. What increases the risk? Smoking. Having type 2 diabetes mellitus, high cholesterol, or both. Not getting enough exercise or physical activity. Being overweight. Having too much fat, sugar, calories, or salt (sodium) in your diet. Drinking too much alcohol. Having long-term (chronic) kidney disease. Having a family history of high blood pressure. Age. Risk increases with age. Race. You may be at higher risk if you are African American. Gender. Men are at higher risk than women before age 20. After age 72, women are at higher risk than men. Having obstructive sleep apnea. Stress. What are the signs or symptoms? High blood pressure may not cause symptoms. Very high blood pressure (hypertensive crisis) may cause: Headache. Feelings of worry or nervousness (anxiety). Shortness of breath. Nosebleed. A feeling of being sick to your stomach (nausea). Throwing up (vomiting). Changes in how you see. Very bad chest pain. Seizures. How is this treated? This condition is treated by making healthy lifestyle changes, such as: Eating healthy foods. Exercising more. Drinking less alcohol. Your health care provider may prescribe medicine if lifestyle changes are not enough to get your blood pressure under control, and if: Your top number is above 130. Your bottom number is above 80. Your personal target blood pressure may vary. Follow these  instructions at home: Eating and drinking  If told, follow the DASH eating plan. To follow this plan: Fill one half of your plate at each meal with fruits and vegetables. Fill one fourth of your plate at each meal with whole grains. Whole grains include whole-wheat pasta, brown rice, and whole-grain bread. Eat or drink low-fat dairy products, such as skim milk or low-fat yogurt. Fill one fourth of your plate at each meal with low-fat (lean) proteins. Low-fat proteins include fish, chicken without skin, eggs, beans, and tofu. Avoid fatty meat, cured and processed meat, or chicken with skin. Avoid pre-made or processed food. Eat less than 1,500 mg of salt each day. Do not drink alcohol if: Your doctor tells you not to drink. You are pregnant, may be pregnant, or are planning to become pregnant. If you drink alcohol: Limit how much you use to: 0-1 drink a day for women. 0-2 drinks a day for men. Be aware of how much alcohol is in your drink. In the [place] one drink equals one 12 oz bottle of beer (355 mL), one 5 oz glass of wine (148 mL), or one 1 oz glass of hard liquor (44 mL).  Lifestyle  Work with your doctor to stay at a healthy weight or to lose weight. Ask your doctor what the best weight is for you. Get at least 30 minutes of exercise most days of the week. This may include walking, swimming, or biking. Get at least 30 minutes of exercise that strengthens your muscles (resistance exercise) at least 3 days a week. This may include lifting weights or doing Pilates. Do not use any products that contain nicotine or tobacco, such as cigarettes,  e-cigarettes, and chewing tobacco. If you need help quitting, ask your doctor. Check your blood pressure at home as told by your doctor. Keep all follow-up visits as told by your doctor. This is important.  Medicines Take over-the-counter and prescription medicines only as told by your doctor. Follow directions carefully. Do not skip doses of  blood pressure medicine. The medicine does not work as well if you skip doses. Skipping doses also puts you at risk for problems. Ask your doctor about side effects or reactions to medicines that you should watch for. Contact a doctor if you: Think you are having a reaction to the medicine you are taking. Have headaches that keep coming back (recurring). Feel dizzy. Have swelling in your ankles. Have trouble with your vision. Get help right away if you: Get a very bad headache. Start to feel mixed up (confused). Feel weak or numb. Feel faint. Have very bad pain in your: Chest. Belly (abdomen). Throw up more than once. Have trouble breathing. Summary Hypertension is another name for high blood pressure. High blood pressure forces your heart to work harder to pump blood. For most people, a normal blood pressure is less than 120/80. Making healthy choices can help lower blood pressure. If your blood pressure does not get lower with healthy choices, you may need to take medicine. This information is not intended to replace advice given to you by your health care provider. Make sure you discuss any questions you have with your healthcare provider. Document Revised: 12/10/2017 Document Reviewed: 12/10/2017 Elsevier Patient Education  2022 [employer] [phone] - she is cash pay  [employer] [facility] - I have place a referral allow 7-10 business days to process

## 2020-10-10 NOTE — Progress Notes (Signed)
I,Yamilka Roman [employer] as a Education administrator for [employer], FNP.,have documented all relevant documentation on the behalf of [name], FNP,as directed by  [name], FNP while in the presence of [name], [employer].   This visit occurred during the SARS-CoV-2 public health emergency.  Safety protocols were in place, including screening questions prior to the visit, additional usage of staff PPE, and extensive cleaning of exam room while observing appropriate contact time as indicated for disinfecting solutions.  Subjective:     Patient ID: Margaret Hill , female    DOB: 1956-02-24 , 65 y.o.   MRN: [id number]   Chief Complaint  Patient presents with   Prediabetes   Hyperlipidemia   Hypertension    HPI  Patient presents today for a bp, prediabetes and cholesterol check.  Wt Readings from Last 3 Encounters: 10/10/20 : 197 lb 9.6 oz (89.6 kg) 03/30/20 : 185 lb 9.6 oz (84.2 kg) 11/16/19 : 187 lb (84.8 kg)  Reports she has been having an increased amounts of pain over the last few months. States "she does not feel depressed but feels miserable" she does verbalize she is not interested in going out with others and does not like to be around others. On the weekend her pain improves but when she goes to work she has pain all over. She is unable to take cymbalta due to causing her "electrical feeling across her head".  She is unable to do Lyrica (had increased weight gain and had worsening pain) she was in a study. She has been to counseling in the past.   She has been to a dermatologist 6 months ago to have a skin tag removed, she is unsure of his name.         Past Medical History:  Diagnosis Date   Fibromyalgia    Hypercholesteremia    Hypertension      Family History  Family history unknown: Yes     Current Outpatient Medications:    Albuterol Sulfate 108 (90 Base) MCG/ACT AEPB, Inhale 2 puffs into the lungs every 6 (six) hours as needed (for shortness of breath or  cough)., Disp: 1 each, Rfl: 1   buPROPion (WELLBUTRIN XL) 150 MG 24 hr tablet, Take 1 tablet (150 mg total) by mouth every morning., Disp: 30 tablet, Rfl: 2   Famotidine 20 MG CHEW, Chew 10 mg by mouth daily., Disp: , Rfl:    fluocinonide ointment (LIDEX) 0.05 %, APPLY EXTERNALLY TO THE AFFECTED AREA TWICE DAILY, Disp: 30 g, Rfl: 1   ibuprofen (ADVIL) 800 MG tablet, TAKE 1 TABLET(800 MG) BY MOUTH EVERY 8 HOURS AS NEEDED, Disp: 60 tablet, Rfl: 1   losartan-hydrochlorothiazide (HYZAAR) 50-12.5 MG tablet, TAKE 1 TABLET BY MOUTH DAILY, Disp: 90 tablet, Rfl: 1   Nutritional Supplements (ESTROVEN PO), Take 1 tablet by mouth daily at 12 noon., Disp: , Rfl:    nystatin cream (MYCOSTATIN), Apply 1 application topically 2 (two) times daily., Disp: 30 g, Rfl: 0   Pitavastatin Calcium (LIVALO) 4 MG TABS, TAKE 1 TABLET(4 MG) BY MOUTH EVERY OTHER DAY, Disp: 90 tablet, Rfl: 1   valACYclovir (VALTREX) 500 MG tablet, Take 1 tablet (500 mg total) by mouth 2 (two) times daily., Disp: 90 tablet, Rfl: 1   Vitamin D, Ergocalciferol, (DRISDOL) 1.25 MG (50000 UNIT) CAPS capsule, TAKE 1 CAPSULE BY MOUTH 2 TIMES A WEEK, Disp: 24 capsule, Rfl: 1   Allergies  Allergen Reactions   Aspirin     REACTION: stomach pain  Ceftriaxone Sodium    Codeine     REACTION: shortness of breath   Doxycycline     REACTION: hives/skin rash   Penicillins     REACTION: hives/skin rash   Simvastatin     REACTION: muscular pain   Sulfonamide Derivatives     REACTION: hives/skin rash   Trimethoprim     REACTION: Hives/skin rash     Review of Systems  Constitutional: Negative.  Negative for fatigue and fever.  Respiratory: Negative.    Cardiovascular: Negative.  Negative for chest pain, palpitations and leg swelling.  Gastrointestinal: Negative.   Musculoskeletal:  Positive for arthralgias.  Neurological:  Negative for dizziness and headaches.  Psychiatric/Behavioral: Negative.         Verbalizes frustration on how she is  feeling with her fibromyalgia    Today's Vitals   10/10/20 1011  BP: 124/80  Pulse: 84  Temp: 98.1 F (36.7 C)  Weight: 197 lb 9.6 oz (89.6 kg)  Height: 5' 2.6" (1.59 m)   Body mass index is 35.45 kg/m.   Objective:  Physical Exam Vitals reviewed.  Constitutional:      General: She is not in acute distress.    Appearance: Normal appearance.  Cardiovascular:     Rate and Rhythm: Normal rate and regular rhythm.     Pulses: Normal pulses.     Heart sounds: Normal heart sounds. No murmur heard. Pulmonary:     Effort: Pulmonary effort is normal. No respiratory distress.     Breath sounds: Normal breath sounds. No wheezing.  Musculoskeletal:        General: No swelling or tenderness. Normal range of motion.     Comments: Right lateral thumb with nodule present non-moveable.  Skin:    General: Skin is warm and dry.     Capillary Refill: Capillary refill takes less than 2 seconds.  Neurological:     General: No focal deficit present.     Mental Status: She is alert and oriented to person, place, and time.     Cranial Nerves: No cranial nerve deficit.     Motor: No weakness.  Psychiatric:        Mood and Affect: Mood normal.        Behavior: Behavior normal.        Thought Content: Thought content normal.        Judgment: Judgment normal.        Assessment And Plan:     1. Prediabetes Chronic, stable Continue with current medications - CMP14+EGFR - Hemoglobin A1c  2. Vitamin D deficiency Will check vitamin D level and supplement as needed.    Also encouraged to spend 15 minutes in the sun daily.  - VITAMIN D 25 Hydroxy (Vit-D Deficiency, Fractures)  3. Essential hypertension Chronic, good control Continue with current medications  4. Hyperlipidemia, unspecified hyperlipidemia type Chronic, she is not able to get her livalo due to insurance does not cover  5. Class 2 obesity due to excess calories with body mass index (BMI) of 35.0 to 35.9 in adult, unspecified  whether serious comorbidity present Chronic Discussed healthy diet and regular exercise options  Encouraged to exercise at least 150 minutes per week with 2 days of strength training as tolerated  6. Fibromyalgia Will try her on wellbutrin as she is having a tough time She was not able to tolerate cymbalta in the past Toradol injection given in office Encouraged to try to exercise as well - buPROPion (WELLBUTRIN XL) 150 MG  24 hr tablet; Take 1 tablet (150 mg total) by mouth every morning.  Dispense: 30 tablet; Refill: 2 - ketorolac (TORADOL) injection 60 mg  7. Moderate episode of recurrent major depressive disorder (HCC) Her depression screen was 17, will start her on wellbutrin She will return in 6 weeks for medication check Discussed side effects and made aware to notify if any change in thoughts - buPROPion (WELLBUTRIN XL) 150 MG 24 hr tablet; Take 1 tablet (150 mg total) by mouth every morning.  Dispense: 30 tablet; Refill: 2 - Ambulatory referral to Psychology  8. Encounter for immunization - Zoster Vaccine Adjuvanted [facility]) injection; Inject 0.5 mLs into the muscle once for 1 dose.  Dispense: 0.5 mL; Refill: 0  9. Atypical nevi Right lateral chest with slight irregular shaped nevi, will refer to dermatology for further evaluation - Ambulatory referral to Dermatology     Patient was given opportunity to ask questions. Patient verbalized understanding of the plan and was able to repeat key elements of the plan. All questions were answered to their satisfaction.  [name], FNP   I, [name], FNP, have reviewed all documentation for this visit. The documentation on 10/11/20 for the exam, diagnosis, procedures, and orders are all accurate and complete.   IF YOU HAVE BEEN REFERRED TO A SPECIALIST, IT MAY TAKE 1-2 WEEKS TO SCHEDULE/PROCESS THE REFERRAL. IF YOU HAVE NOT HEARD FROM US/SPECIALIST IN TWO WEEKS, PLEASE GIVE [place] A CALL AT [phone] X [phone].   THE PATIENT IS  ENCOURAGED TO PRACTICE SOCIAL DISTANCING DUE TO THE COVID-19 PANDEMIC.

## 2020-10-11 ENCOUNTER — Encounter: Payer: Self-pay | Admitting: Nurse Practitioner

## 2020-10-11 LAB — CMP14+EGFR
ALT: 17 IU/L (ref 0–32)
AST: 15 IU/L (ref 0–40)
Albumin/Globulin Ratio: 1.7 (ref 1.2–2.2)
Albumin: 4.7 g/dL (ref 3.8–4.8)
Alkaline Phosphatase: 86 IU/L (ref 44–121)
BUN/Creatinine Ratio: 19 (ref 12–28)
BUN: 14 mg/dL (ref 8–27)
Bilirubin Total: 0.5 mg/dL (ref 0.0–1.2)
CO2: 22 mmol/L (ref 20–29)
Calcium: 9.3 mg/dL (ref 8.7–10.3)
Chloride: 99 mmol/L (ref 96–106)
Creatinine, Ser: 0.72 mg/dL (ref 0.57–1.00)
Globulin, Total: 2.7 g/dL (ref 1.5–4.5)
Glucose: 87 mg/dL (ref 65–99)
Potassium: 4.3 mmol/L (ref 3.5–5.2)
Sodium: 139 mmol/L (ref 134–144)
Total Protein: 7.4 g/dL (ref 6.0–8.5)
eGFR: 93 mL/min/{1.73_m2} (ref >59)

## 2020-10-11 LAB — HEMOGLOBIN A1C
Est. average glucose Bld gHb Est-mCnc: 134 mg/dL
Hgb A1c MFr Bld: 6.3 % — ABNORMAL HIGH (ref 4.8–5.6)

## 2020-10-11 LAB — VITAMIN D 25 HYDROXY (VIT D DEFICIENCY, FRACTURES): Vit D, 25-Hydroxy: 56.5 ng/mL (ref 30.0–100.0)

## 2020-10-25 ENCOUNTER — Encounter: Payer: Self-pay | Admitting: Nurse Practitioner

## 2020-11-08 ENCOUNTER — Ambulatory Visit: Payer: 59 | Admitting: Nurse Practitioner

## 2020-11-08 ENCOUNTER — Encounter: Payer: Self-pay | Admitting: Nurse Practitioner

## 2020-11-08 ENCOUNTER — Other Ambulatory Visit: Payer: Self-pay

## 2020-11-08 VITALS — BP 118/76 | HR 92 | Temp 98.5°F | Ht 62.6 in | Wt 199.0 lb

## 2020-11-08 DIAGNOSIS — M797 Fibromyalgia: Secondary | ICD-10-CM

## 2020-11-08 DIAGNOSIS — Z23 Encounter for immunization: Secondary | ICD-10-CM

## 2020-11-08 DIAGNOSIS — F331 Major depressive disorder, recurrent, moderate: Secondary | ICD-10-CM

## 2020-11-08 MED ORDER — BUPROPION HCL ER (XL) 150 MG PO TB24: 150.0000 mg | ORAL_TABLET | ORAL | 1 refills | Status: DC

## 2020-11-08 NOTE — Patient Instructions (Addendum)
http://APA.org/depression-guideline"> https://clinicalkey.com"> http://point-of-care.elsevierperformancemanager.com/skills/"> http://point-of-care.elsevierperformancemanager.com">  Managing Depression, Adult Depression is a mental health condition that affects your thoughts, feelings, and actions. Being diagnosed with depression can bring you relief if you did not know why you have felt or behaved a certain way. It could also leave you feeling overwhelmed with uncertainty about your future. Preparing yourself tomanage your symptoms can help you feel more positive about your future. How to manage lifestyle changes Managing stress  Stress is your body's reaction to life changes and events, both good and bad. Stress can add to your feelings of depression. Learning to manage your stresscan help lessen your feelings of depression. Try some of the following approaches to reducing your stress (stress reduction techniques): Listen to music that you enjoy and that inspires you. Try using a meditation app or take a meditation class. Develop a practice that helps you connect with your spiritual self. Walk in nature, pray, or go to a place of worship. Do some deep breathing. To do this, inhale slowly through your nose. Pause at the top of your inhale for a few seconds and then exhale slowly, letting your muscles relax. Practice yoga to help relax and work your muscles. Choose a stress reduction technique that suits your lifestyle and personality. These techniques take time and practice to develop. Set aside 5-15 minutes a day to do them. Therapists can offer training in these techniques. Other things you can do to manage stress include: Keeping a stress diary. Knowing your limits and saying no when you think something is too much. Paying attention to how you react to certain situations. You may not be able to control everything, but you can change your reaction. Adding humor to your life by watching funny films  or TV shows. Making time for activities that you enjoy and that relax you.  Medicines Medicines, such as antidepressants, are often a part of treatment for depression. Talk with your pharmacist or health care provider about all the medicines, supplements, and herbal products that you take, their possible side effects, and what medicines and other products are safe to take together. Make sure to report any side effects you may have to your health care provider. Relationships Your health care provider may suggest family therapy, couples therapy, orindividual therapy as part of your treatment. How to recognize changes Everyone responds differently to treatment for depression. As you recover from depression, you may start to: Have more interest in doing activities. Feel less hopeless. Have more energy. Overeat less often, or have a better appetite. Have better mental focus. It is important to recognize if your depression is not getting better or is getting worse. The symptoms you had in the beginning may return, such as: Tiredness (fatigue) or low energy. Eating too much or too little. Sleeping too much or too little. Feeling restless, agitated, or hopeless. Trouble focusing or making decisions. Unexplained physical complaints. Feeling irritable, angry, or aggressive. If you or your family members notice these symptoms coming back, let yourhealth care provider know right away. Follow these instructions at home: Activity  Try to get some form of exercise each day, such as walking, biking, swimming, or lifting weights. Practice stress reduction techniques. Engage your mind by taking a class or doing some volunteer work.  Lifestyle Get the right amount and quality of sleep. Cut down on using caffeine, tobacco, alcohol, and other potentially harmful substances. Eat a healthy diet that includes plenty of vegetables, fruits, whole grains, low-fat dairy products, and lean protein. Do not   eat  a lot of foods that are high in solid fats, added sugars, or salt (sodium). General instructions Take over-the-counter and prescription medicines only as told by your health care provider. Keep all follow-up visits as told by your health care provider. This is important. Where to find support Talking to others  Friends and family members can be sources of support and guidance. Talk to trusted friends or family members about your condition. Explain your symptoms to them, and let them know that you are working with a health care provider to treat your depression. Tell friends and family members how they also can behelpful. Finances Find appropriate mental health providers that fit with your financial situation. Talk with your health care provider about options to get reduced prices on your medicines. Where to find more information You can find support in your area from: Anxiety and Depression Association of America (ADAA): www.adaa.org Mental Health America: www.mentalhealthamerica.net [employer] on Mental Illness: www.nami.org Contact a health care provider if: You stop taking your antidepressant medicines, and you have any of these symptoms: Nausea. Headache. Light-headedness. Chills and body aches. Not being able to sleep (insomnia). You or your friends and family think your depression is getting worse. Get help right away if: You have thoughts of hurting yourself or others. If you ever feel like you may hurt yourself or others, or have thoughts about taking your own life, get help right away. Go to your nearest emergency department or: Call your local emergency services (911 in the [place] Call a suicide crisis helpline, such as the [facility] at (870)360-1978. This is open 24 hours a day in the [place] Text the Crisis Text Line at 778 480 7408 (in the [place].). Summary If you are diagnosed with depression, preparing yourself to manage your symptoms is a good way  to feel positive about your future. Work with your health care provider on a management plan that includes stress reduction techniques, medicines (if applicable), therapy, and healthy lifestyle habits. Keep talking with your health care provider about how your treatment is working. If you have thoughts about taking your own life, call a suicide crisis helpline or text a crisis text line. This information is not intended to replace advice given to you by your health care provider. Make sure you discuss any questions you have with your healthcare provider. Document Revised: 02/10/2019 Document Reviewed: 02/10/2019 Elsevier Patient Education  [place].   Pneumococcal Conjugate Vaccine (PCV13): What You Need to Know 1. Why get vaccinated? Pneumococcal conjugate vaccine (PCV13) can prevent pneumococcal disease. Pneumococcal disease refers to any illness caused by pneumococcal bacteria. These bacteria can cause many types of illnesses, including pneumonia, which is an infection ofthe lungs. Pneumococcal bacteria are one of the most common causes of pneumonia. Besides pneumonia, pneumococcal bacteria can also cause: Ear infections Sinus infections Meningitis (infection of the tissue covering the brain and spinal cord) Bacteremia (infection of the blood) Anyone can get pneumococcal disease, but children under 4 years old, people with certain medical conditions, adults 23 years or older, and cigarettesmokers are at the highest risk. Most pneumococcal infections are mild. However, some can result in long-term problems, such as brain damage or hearing loss. Meningitis, bacteremia, andpneumonia caused by pneumococcal disease can be fatal. 2. PCV13 PCV13 protects against 13 types of bacteria that cause pneumococcal disease. Infants and young children usually need 4 doses of pneumococcal conjugate vaccine, at ages 54, 68, 62, and 12-15 months. Older children (through age 6 months) may  be vaccinated  if they did not receive the recommended doses. A dose of PCV13 is also recommended for adults and children 6 years or olderwith certain medical conditions if they did not already receive PCV13. This vaccine may be given to healthy adults 64 years or older who did not already receive PCV13, based on discussions between the patientand health care provider. 3. Talk with your health care provider Tell your vaccination provider if the person getting the vaccine: Has had an allergic reaction after a previous dose of PCV13, to an earlier pneumococcal conjugate vaccine known as PCV7, or to any vaccine containing diphtheria toxoid (for example, DTaP), or has any severe, life-threatening allergies In some cases, your health care provider may decide to postpone PCV13vaccination until a future visit. People with minor illnesses, such as a cold, may be vaccinated. People who are moderately or severely ill should usually wait until they recover beforegetting PCV13. Your health care provider can give you more information. 4. Risks of a vaccine reaction Redness, swelling, pain, or tenderness where the shot is given, and fever, loss of appetite, fussiness (irritability), feeling tired, headache, and chills can happen after PCV13 vaccination. Young children may be at increased risk for seizures caused by fever after PCV13 if it is administered at the same time as inactivated influenza vaccine.Ask your health care provider for more information. People sometimes faint after medical procedures, including vaccination. Tellyour provider if you feel dizzy or have vision changes or ringing in the ears. As with any medicine, there is a very remote chance of a vaccine causing asevere allergic reaction, other serious injury, or death. 5. What if there is a serious problem? An allergic reaction could occur after the vaccinated person leaves the clinic. If you see signs of a severe allergic reaction (hives, swelling of the face  and throat, difficulty breathing, a fast heartbeat, dizziness, or weakness), call 9-1-1 and get the person to the nearest hospital. For other signs that concern you, call your health care provider. Adverse reactions should be reported to the Vaccine Adverse Event Reporting System (VAERS). Your health care provider will usually file this report, or you can do it yourself. Visit the VAERS website at www.vaers.SamedayNews.es or call 7573946969. VAERS is only for reporting reactions, and VAERS staff members do not give medical advice. 6. The National Vaccine Injury Compensation Program The [employer] Vaccine Injury Compensation Program (VICP) is a federal program that was created to compensate people who may have been injured by certain vaccines. Claims regarding alleged injury or death due to vaccination have a time limit for filing, which may be as short as two years. Visit the VICP website at GoldCloset.com.ee or call 845-377-9398 to learn about the program and about filing a claim. 7. How can I learn more? Ask your health care provider. Call your local or state health department. Visit the website of the Food and Drug Administration (FDA) for vaccine package inserts and additional information at TraderRating.uy. Contact the Centers for Disease Control and Prevention (CDC): Call 928-455-4939 (1-800-CDC-INFO) or Visit CDC's website at http://hunter.com/. Vaccine Information Statement PCV13 (11/19/2019) This information is not intended to replace advice given to you by your health care provider. Make sure you discuss any questions you have with your healthcare provider. Document Revised: 01/06/2020 Document Reviewed: 01/06/2020 Elsevier Patient Education  2022 [employer].

## 2020-11-08 NOTE — Progress Notes (Signed)
I,Margaret Hill,acting as a Education administrator for [employer], FNP.,have documented all relevant documentation on the behalf of Margaret Brine, FNP,as directed by  Margaret Brine, FNP while in the presence of Margaret Hill, [place].  This visit occurred during the SARS-CoV-2 public health emergency.  Safety protocols were in place, including screening questions prior to the visit, additional usage of staff PPE, and extensive cleaning of exam room while observing appropriate contact time as indicated for disinfecting solutions.  Subjective:     Patient ID: Margaret Hill , female    DOB: 07/03/1955 , 65 y.o.   MRN: [id number]   Chief Complaint  Patient presents with   Depression     HPI  She is here today for follow up new medication with wellbutrin. She has been taking in the morning and feels like she is more balanced. Denies any side effects, denies any weird thoughts.   She has an appt with the Dermatologist in August.   Wt Readings from Last 3 Encounters: 11/08/20 : 199 lb (90.3 kg) 10/10/20 : 197 lb 9.6 oz (89.6 kg) 03/30/20 : 185 lb 9.6 oz (84.2 kg)      Past Medical History:  Diagnosis Date   Fibromyalgia    Hypercholesteremia    Hypertension      Family History  Family history unknown: Yes     Current Outpatient Medications:    Albuterol Sulfate 108 (90 Base) MCG/ACT AEPB, Inhale 2 puffs into the lungs every 6 (six) hours as needed (for shortness of breath or cough)., Disp: 1 each, Rfl: 1   Famotidine 20 MG CHEW, Chew 10 mg by mouth daily., Disp: , Rfl:    fluocinonide ointment (LIDEX) 0.05 %, APPLY EXTERNALLY TO THE AFFECTED AREA TWICE DAILY, Disp: 30 g, Rfl: 1   ibuprofen (ADVIL) 800 MG tablet, TAKE 1 TABLET(800 MG) BY MOUTH EVERY 8 HOURS AS NEEDED, Disp: 60 tablet, Rfl: 1   losartan-hydrochlorothiazide (HYZAAR) 50-12.5 MG tablet, TAKE 1 TABLET BY MOUTH DAILY, Disp: 90 tablet, Rfl: 1   Nutritional Supplements (ESTROVEN PO), Take 1 tablet by mouth daily at 12 noon., Disp: ,  Rfl:    nystatin cream (MYCOSTATIN), Apply 1 application topically 2 (two) times daily., Disp: 30 g, Rfl: 0   Pitavastatin Calcium (LIVALO) 4 MG TABS, TAKE 1 TABLET(4 MG) BY MOUTH EVERY OTHER DAY, Disp: 90 tablet, Rfl: 1   valACYclovir (VALTREX) 500 MG tablet, Take 1 tablet (500 mg total) by mouth 2 (two) times daily., Disp: 90 tablet, Rfl: 1   Vitamin D, Ergocalciferol, (DRISDOL) 1.25 MG (50000 UNIT) CAPS capsule, TAKE 1 CAPSULE BY MOUTH 2 TIMES A WEEK, Disp: 24 capsule, Rfl: 1   buPROPion (WELLBUTRIN XL) 150 MG 24 hr tablet, Take 1 tablet (150 mg total) by mouth every morning., Disp: 90 tablet, Rfl: 1   Allergies  Allergen Reactions   Aspirin     REACTION: stomach pain   Ceftriaxone Sodium    Codeine     REACTION: shortness of breath   Doxycycline     REACTION: hives/skin rash   Penicillins     REACTION: hives/skin rash   Simvastatin     REACTION: muscular pain   Sulfonamide Derivatives     REACTION: hives/skin rash   Trimethoprim     REACTION: Hives/skin rash     Review of Systems  Constitutional: Negative.  Negative for fatigue and fever.  Respiratory: Negative.    Cardiovascular: Negative.  Negative for chest pain, palpitations and leg swelling.  Gastrointestinal: Negative.  Musculoskeletal:  Negative for arthralgias.  Neurological:  Negative for dizziness and headaches.  Psychiatric/Behavioral: Negative.         Frustration has improved and not having as many symptoms with fibromyalgia    Today's Vitals   11/08/20 0906  BP: 118/76  Pulse: 92  Temp: 98.5 F (36.9 C)  TempSrc: Oral  Weight: 199 lb (90.3 kg)  Height: 5' 2.6" (1.59 m)   Body mass index is 35.7 kg/m.  BP Readings from Last 3 Encounters:  11/08/20 118/76  10/10/20 124/80  03/30/20 118/76    Wt Readings from Last 3 Encounters:  11/08/20 199 lb (90.3 kg)  10/10/20 197 lb 9.6 oz (89.6 kg)  03/30/20 185 lb 9.6 oz (84.2 kg)      Objective:  Physical Exam Vitals reviewed.  Constitutional:       General: She is not in acute distress.    Appearance: Normal appearance.  Cardiovascular:     Rate and Rhythm: Normal rate and regular rhythm.     Pulses: Normal pulses.     Heart sounds: Normal heart sounds. No murmur heard. Pulmonary:     Effort: Pulmonary effort is normal. No respiratory distress.     Breath sounds: Normal breath sounds. No wheezing.  Skin:    General: Skin is warm and dry.  Neurological:     General: No focal deficit present.     Mental Status: She is alert and oriented to person, place, and time.     Cranial Nerves: No cranial nerve deficit.     Motor: No weakness.  Psychiatric:        Mood and Affect: Mood normal.        Behavior: Behavior normal.        Thought Content: Thought content normal.        Judgment: Judgment normal.        Assessment And Plan:     1. Moderate episode of recurrent major depressive disorder (HCC) Chronic, she is doing well with her wellbutrin - buPROPion (WELLBUTRIN XL) 150 MG 24 hr tablet; Take 1 tablet (150 mg total) by mouth every morning.  Dispense: 90 tablet; Refill: 1  2. Fibromyalgia She is doing better since taking wellbutrin  - buPROPion (WELLBUTRIN XL) 150 MG 24 hr tablet; Take 1 tablet (150 mg total) by mouth every morning.  Dispense: 90 tablet; Refill: 1  3. Encounter for immunization Pneumonia 23 vaccine given in office - Pneumococcal polysaccharide vaccine 23-valent greater than or equal to 2yo subcutaneous/IM    Patient was given opportunity to ask questions. Patient verbalized understanding of the plan and was able to repeat key elements of the plan. All questions were answered to their satisfaction.  Margaret Brine, FNP   I, Margaret Brine, FNP, have reviewed all documentation for this visit. The documentation on 11/08/20 for the exam, diagnosis, procedures, and orders are all accurate and complete.   IF YOU HAVE BEEN REFERRED TO A SPECIALIST, IT MAY TAKE 1-2 WEEKS TO SCHEDULE/PROCESS THE REFERRAL. IF YOU HAVE  NOT HEARD FROM US/SPECIALIST IN TWO WEEKS, PLEASE GIVE [place] A CALL AT [phone] X [phone].   THE PATIENT IS ENCOURAGED TO PRACTICE SOCIAL DISTANCING DUE TO THE COVID-19 PANDEMIC.

## 2020-11-09 ENCOUNTER — Encounter: Payer: Self-pay | Admitting: Nurse Practitioner

## 2020-11-14 ENCOUNTER — Other Ambulatory Visit: Payer: Self-pay | Admitting: Nurse Practitioner

## 2020-11-15 ENCOUNTER — Other Ambulatory Visit: Payer: Self-pay | Admitting: Nurse Practitioner

## 2020-11-15 DIAGNOSIS — Z789 Other specified health status: Secondary | ICD-10-CM

## 2020-11-15 DIAGNOSIS — M797 Fibromyalgia: Secondary | ICD-10-CM

## 2020-11-15 DIAGNOSIS — E782 Mixed hyperlipidemia: Secondary | ICD-10-CM

## 2020-11-22 ENCOUNTER — Encounter: Payer: Self-pay | Admitting: Nurse Practitioner

## 2020-12-12 ENCOUNTER — Ambulatory Visit: Payer: 59

## 2020-12-12 ENCOUNTER — Other Ambulatory Visit: Payer: Self-pay

## 2020-12-12 DIAGNOSIS — Z23 Encounter for immunization: Secondary | ICD-10-CM

## 2020-12-12 MED ORDER — ZOSTER VAC RECOMB ADJUVANTED 50 MCG/0.5ML IM SUSR: 0.5000 mL | Freq: Once | INTRAMUSCULAR | 0 refills | Status: AC

## 2021-02-06 ENCOUNTER — Other Ambulatory Visit: Payer: Self-pay | Admitting: Internal Medicine

## 2021-02-06 DIAGNOSIS — Z1231 Encounter for screening mammogram for malignant neoplasm of breast: Secondary | ICD-10-CM

## 2021-02-16 ENCOUNTER — Telehealth (HOSPITAL_BASED_OUTPATIENT_CLINIC_OR_DEPARTMENT_OTHER): Payer: 59 | Admitting: Internal Medicine

## 2021-03-07 ENCOUNTER — Other Ambulatory Visit: Payer: Self-pay | Admitting: Nurse Practitioner

## 2021-03-07 DIAGNOSIS — I10 Essential (primary) hypertension: Secondary | ICD-10-CM

## 2021-03-14 ENCOUNTER — Encounter (HOSPITAL_BASED_OUTPATIENT_CLINIC_OR_DEPARTMENT_OTHER): Payer: Self-pay | Admitting: Internal Medicine

## 2021-03-14 ENCOUNTER — Ambulatory Visit (HOSPITAL_BASED_OUTPATIENT_CLINIC_OR_DEPARTMENT_OTHER): Payer: Medicare Other | Admitting: Internal Medicine

## 2021-03-14 ENCOUNTER — Other Ambulatory Visit: Payer: Self-pay

## 2021-03-14 VITALS — BP 134/74 | HR 67 | Ht 62.0 in | Wt 199.0 lb

## 2021-03-14 DIAGNOSIS — M797 Fibromyalgia: Secondary | ICD-10-CM | POA: Diagnosis not present

## 2021-03-14 DIAGNOSIS — E785 Hyperlipidemia, unspecified: Secondary | ICD-10-CM | POA: Diagnosis not present

## 2021-03-14 DIAGNOSIS — Z789 Other specified health status: Secondary | ICD-10-CM | POA: Diagnosis not present

## 2021-03-14 DIAGNOSIS — Z8249 Family history of ischemic heart disease and other diseases of the circulatory system: Secondary | ICD-10-CM | POA: Diagnosis not present

## 2021-03-14 MED ORDER — PITAVASTATIN MAGNESIUM 4 MG PO TABS: 1.0000 | ORAL_TABLET | ORAL | 11 refills | Status: DC

## 2021-03-14 NOTE — Patient Instructions (Addendum)
Medication Instructions:  START pitavastatin magnesium (Zyptimag) 4mg  every other day  *If you need a refill on your cardiac medications before your next appointment, please call your pharmacy*   Lab Work: FASTING lab work to check cholesterol in about 3-4 months -- complete 1 week before your next visit with Dr. Debara Pickett   If you have labs (blood work) drawn today and your tests are completely normal, you will receive your results only by: [place] (if you have MyChart) OR A paper copy in the mail If you have any lab test that is abnormal or we need to change your treatment, we will call you to review the results.   Testing/Procedures: Dr. Debara Pickett has ordered a CT coronary calcium score. This test is done at [facility]. This is $99 out of pocket.   Coronary CalciumScan A coronary calcium scan is an imaging test used to look for deposits of calcium and other fatty materials (plaques) in the inner lining of the blood vessels of the heart (coronary arteries). These deposits of calcium and plaques can partly clog and narrow the coronary arteries without producing any symptoms or warning signs. This puts a person at risk for a heart attack. This test can detect these deposits before symptoms develop. Tell a health care provider about: Any allergies you have. All medicines you are taking, including vitamins, herbs, eye drops, creams, and over-the-counter medicines. Any problems you or family members have had with anesthetic medicines. Any blood disorders you have. Any surgeries you have had. Any medical conditions you have. Whether you are pregnant or may be pregnant. What are the risks? Generally, this is a safe procedure. However, problems may occur, including: Harm to a pregnant woman and her unborn baby. This test involves the use of radiation. Radiation exposure can be dangerous to a pregnant woman and her unborn baby. If you are pregnant, you generally should not have  this procedure done. Slight increase in the risk of cancer. This is because of the radiation involved in the test. What happens before the procedure? No preparation is needed for this procedure. What happens during the procedure? You will undress and remove any jewelry around your neck or chest. You will put on a hospital gown. Sticky electrodes will be placed on your chest. The electrodes will be connected to an electrocardiogram (ECG) machine to record a tracing of the electrical activity of your heart. A CT scanner will take pictures of your heart. During this time, you will be asked to lie still and hold your breath for 2-3 seconds while a picture of your heart is being taken. The procedure may vary among health care providers and hospitals. What happens after the procedure? You can get dressed. You can return to your normal activities. It is up to you to get the results of your test. Ask your health care provider, or the department that is doing the test, when your results will be ready. Summary A coronary calcium scan is an imaging test used to look for deposits of calcium and other fatty materials (plaques) in the inner lining of the blood vessels of the heart (coronary arteries). Generally, this is a safe procedure. Tell your health care provider if you are pregnant or may be pregnant. No preparation is needed for this procedure. A CT scanner will take pictures of your heart. You can return to your normal activities after the scan is done. This information is not intended to replace advice given to you by your health  care provider. Make sure you discuss any questions you have with your health care provider. Document Released: 09/28/2007 Document Revised: 02/19/2016 Document Reviewed: 02/19/2016 Elsevier Interactive Patient Education  2017 [place]: At [facility], you and your health needs are our priority.  As part of our continuing mission to provide you  with exceptional heart care, we have created designated Provider Care Teams.  These Care Teams include your primary Cardiologist (physician) and Advanced Practice Providers (APPs -  Physician Assistants and Nurse Practitioners) who all work together to provide you with the care you need, when you need it.  We recommend signing up for the patient portal called "MyChart".  Sign up information is provided on this After Visit Summary.  MyChart is used to connect with patients for Virtual Visits (Telemedicine).  Patients are able to view lab/test results, encounter notes, upcoming appointments, etc.  Non-urgent messages can be sent to your provider as well.   To learn more about what you can do with MyChart, go to [email or web address].    Your next appointment:   4 month(s)  The format for your next appointment:   In Person  Provider:   Lyman Bishop MD

## 2021-03-14 NOTE — Progress Notes (Signed)
LIPID CLINIC CONSULT NOTE  Chief Complaint:  Manage dyslipidemia  Primary Care Physician: [name], FNP  Primary Cardiologist:  None  HPI:  Margaret Hill is a 64 y.o. female who is being seen today for the evaluation of dyslipidemia at the request of [name], [place].  This is a pleasant 65 year old female with a history of fibromyalgia intolerance, dyslipidemia, hypertension and family history of heart disease including her father who had a stent in his 32s, mother with diabetes, and brother who had heart disease and a pacemaker in his 64s.  She has longstanding elevated cholesterol but had difficulty taking statins because it worsened her fibromyalgia.  More recently she was able to take Livalo 4 mg every other day.  Unfortunately her insurance did not cover it the cost is becoming prohibitive.  She has received samples from her PCP.  Recent labs on therapy in December 2021 showed total cholesterol 178, HDL 71, triglycerides 52 and LDL 97.  This is reasonable treatment however of course given her family history her targets may need to be lower.  We discussed the possibility of further coronary evaluation with calcium scoring.  PMHx:  Past Medical History:  Diagnosis Date   Fibromyalgia    H/O total hysterectomy    Hypercholesteremia    Hypertension     Past Surgical History:  Procedure Laterality Date   BILATERAL TEMPOROMANDIBULAR JOINT ARTHROPLASTY  1990   GALLBLADDER SURGERY  1983   PARTIAL HYSTERECTOMY  1991    FAMHx:  Family History  Problem Relation Age of Onset   Diabetes Mother    Emphysema Father     SOCHx:   reports that she has never smoked. She has never used smokeless tobacco. She reports that she does not currently use alcohol. She reports that she does not use drugs.  ALLERGIES:  Allergies  Allergen Reactions   Aspirin     REACTION: stomach pain   Ceftriaxone Sodium    Codeine     REACTION: shortness of breath   Penicillins     REACTION:  hives/skin rash   Simvastatin     REACTION: muscular pain   Sulfonamide Derivatives     REACTION: hives/skin rash   Trimethoprim     REACTION: Hives/skin rash    ROS: Pertinent items noted in HPI and remainder of comprehensive ROS otherwise negative.  HOME MEDS: Current Outpatient Medications on File Prior to Visit  Medication Sig Dispense Refill   Albuterol Sulfate 108 (90 Base) MCG/ACT AEPB Inhale 2 puffs into the lungs every 6 (six) hours as needed (for shortness of breath or cough). 1 each 1   buPROPion (WELLBUTRIN XL) 150 MG 24 hr tablet Take 1 tablet (150 mg total) by mouth every morning. 90 tablet 1   fluocinonide ointment (LIDEX) 0.05 % APPLY EXTERNALLY TO THE AFFECTED AREA TWICE DAILY 30 g 1   ibuprofen (ADVIL) 800 MG tablet TAKE 1 TABLET(800 MG) BY MOUTH EVERY 8 HOURS AS NEEDED 60 tablet 1   losartan-hydrochlorothiazide (HYZAAR) 50-12.5 MG tablet TAKE 1 TABLET BY MOUTH DAILY 90 tablet 1   nystatin cream (MYCOSTATIN) Apply 1 application topically 2 (two) times daily. 30 g 0   valACYclovir (VALTREX) 500 MG tablet Take 1 tablet (500 mg total) by mouth 2 (two) times daily. 90 tablet 1   Vitamin D, Ergocalciferol, (DRISDOL) 1.25 MG (50000 UNIT) CAPS capsule TAKE 1 CAPSULE BY MOUTH 2 TIMES A WEEK 24 capsule 1   omeprazole (PRILOSEC) 40 MG capsule Take 1  capsule by mouth daily.     No current facility-administered medications on file prior to visit.    LABS/IMAGING: No results found for this or any previous visit (from the past 48 hour(s)). No results found.  LIPID PANEL:    Component Value Date/Time   CHOL 178 03/30/2020 1155   TRIG 52 03/30/2020 1155   HDL 71 03/30/2020 1155   CHOLHDL 2.5 03/30/2020 1155   CHOLHDL 2.2 Ratio 01/22/2008 2254   VLDL 12 01/22/2008 2254   LDLCALC 97 03/30/2020 1155    WEIGHTS: Wt Readings from Last 3 Encounters:  03/14/21 199 lb (90.3 kg)  11/08/20 199 lb (90.3 kg)  10/10/20 197 lb 9.6 oz (89.6 kg)    VITALS: BP 134/74   Pulse 67    Ht 5\' 2"  (1.575 m)   Wt 199 lb (90.3 kg)   SpO2 98%   BMI 36.40 kg/m   EXAM: General appearance: alert and no distress Neck: no carotid bruit, no JVD, and thyroid not enlarged, symmetric, no tenderness/mass/nodules Lungs: clear to auscultation bilaterally Heart: regular rate and rhythm, S1, S2 normal, no murmur, click, rub or gallop Abdomen: soft, non-tender; bowel sounds normal; no masses,  no organomegaly Extremities: extremities normal, atraumatic, no cyanosis or edema Pulses: 2+ and symmetric Skin: Skin color, texture, turgor normal. No rashes or lesions Neurologic: Grossly normal Pleasant  EKG: Deferred  ASSESSMENT: Mixed dyslipidemia Statin intolerant Hypertension Family history of premature coronary disease Fibromyalgia  PLAN: 1.   Ms. Margaret Hill has a mixed dyslipidemia but has been intolerant to a number of statins.  She has seemingly tolerated Livalo 4 mg every other day.  Cholesterol is reasonable with LDL less than 100.  I recommended a calcium score to further risk ratified her.  If her score is 0 or low then we could continue this therapy.  I have also prescribed her for pitavastatin magnesium (Zypitamag), which is similar to Livalo but may be cheaper at $35 a month through the mail order pharmacy [facility] in [place].  She should remain on this but may need additional therapy.  We will contact her with the results of her calcium score and any further adjustments.  Thanks again for the kind referral.  Pixie Casino, MD, FACC, [place] Director of the [facility] Diplomate of the American Board of Clinical Lipidology Attending Cardiologist  Direct Dial: [phone]  Fax: [phone]  Website:  [email or web address] 03/14/2021, 8:12 PM

## 2021-03-19 ENCOUNTER — Ambulatory Visit
Admission: RE | Admit: 2021-03-19 | Discharge: 2021-03-19 | Disposition: A | Discharge status: Home / Self Care | Payer: Medicare Other | Source: Ambulatory Visit

## 2021-03-19 DIAGNOSIS — Z1231 Encounter for screening mammogram for malignant neoplasm of breast: Secondary | ICD-10-CM

## 2021-03-28 ENCOUNTER — Other Ambulatory Visit: Payer: Self-pay

## 2021-03-28 ENCOUNTER — Ambulatory Visit (HOSPITAL_BASED_OUTPATIENT_CLINIC_OR_DEPARTMENT_OTHER)
Admission: RE | Admit: 2021-03-28 | Discharge: 2021-03-28 | Disposition: A | Discharge status: Home / Self Care | Payer: Medicare Other | Source: Ambulatory Visit | Attending: Internal Medicine | Admitting: Internal Medicine

## 2021-03-28 DIAGNOSIS — E785 Hyperlipidemia, unspecified: Secondary | ICD-10-CM | POA: Insufficient documentation

## 2021-04-03 ENCOUNTER — Encounter: Payer: Self-pay | Admitting: Nurse Practitioner

## 2021-04-03 ENCOUNTER — Ambulatory Visit (INDEPENDENT_AMBULATORY_CARE_PROVIDER_SITE_OTHER): Payer: Medicare Other | Admitting: Nurse Practitioner

## 2021-04-03 ENCOUNTER — Other Ambulatory Visit: Payer: Self-pay

## 2021-04-03 VITALS — BP 130/70 | HR 65 | Temp 98.4°F | Ht 63.0 in | Wt 197.8 lb

## 2021-04-03 DIAGNOSIS — E2839 Other primary ovarian failure: Secondary | ICD-10-CM

## 2021-04-03 DIAGNOSIS — E782 Mixed hyperlipidemia: Secondary | ICD-10-CM | POA: Diagnosis not present

## 2021-04-03 DIAGNOSIS — R7303 Prediabetes: Secondary | ICD-10-CM

## 2021-04-03 DIAGNOSIS — E559 Vitamin D deficiency, unspecified: Secondary | ICD-10-CM

## 2021-04-03 DIAGNOSIS — Z23 Encounter for immunization: Secondary | ICD-10-CM | POA: Diagnosis not present

## 2021-04-03 DIAGNOSIS — Z6835 Body mass index (BMI) 35.0-35.9, adult: Secondary | ICD-10-CM

## 2021-04-03 DIAGNOSIS — I10 Essential (primary) hypertension: Secondary | ICD-10-CM | POA: Diagnosis not present

## 2021-04-03 DIAGNOSIS — E6609 Other obesity due to excess calories: Secondary | ICD-10-CM

## 2021-04-03 LAB — POCT UA - MICROALBUMIN
Creatinine, POC: 300 mg/dL
Microalbumin Ur, POC: 80 mg/L

## 2021-04-03 LAB — POCT URINALYSIS DIPSTICK
Blood, UA: NEGATIVE
Glucose, UA: NEGATIVE
Ketones, UA: NEGATIVE
Leukocytes, UA: NEGATIVE
Nitrite, UA: NEGATIVE
Protein, UA: POSITIVE — AB
Spec Grav, UA: 1.02 (ref 1.010–1.025)
Urobilinogen, UA: 0.2 E.U./dL
pH, UA: 7 (ref 5.0–8.0)

## 2021-04-03 MED ORDER — TETANUS-DIPHTH-ACELL PERTUSSIS 5-2.5-18.5 LF-MCG/0.5 IM SUSP: 0.5000 mL | Freq: Once | INTRAMUSCULAR | 0 refills | Status: AC

## 2021-04-03 NOTE — Patient Instructions (Addendum)

## 2021-04-03 NOTE — Progress Notes (Signed)
I,Tianna Badgett,acting as a Education administrator for [employer], FNP.,have documented all relevant documentation on the behalf of [name], FNP,as directed by  [name], FNP while in the presence of [name], [place].  This visit occurred during the SARS-CoV-2 public health emergency.  Safety protocols were in place, including screening questions prior to the visit, additional usage of staff PPE, and extensive cleaning of exam room while observing appropriate contact time as indicated for disinfecting solutions.  Subjective:     Patient ID: Margaret Hill , female    DOB: 1956/02/12 , 65 y.o.   MRN: [id number]   Chief Complaint  Patient presents with   Hypertension    HPI  Patient is here today for bp and prediabetes follow up. She is now pitivastatin magnesium will have her lipids done prior to her next.  Wt Readings from Last 3 Encounters: 04/03/21 : 197 lb 12.8 oz (89.7 kg) 03/14/21 : 199 lb (90.3 kg) 11/08/20 : 199 lb (90.3 kg)    Hypertension This is a chronic problem. The current episode started more than 1 year ago. The problem is unchanged. The problem is controlled. Pertinent negatives include no anxiety, chest pain, palpitations, peripheral edema or shortness of breath. There are no associated agents to hypertension. Risk factors for coronary artery disease include obesity and sedentary lifestyle. Compliance problems include exercise.  There is no history of angina or CVA. There is no history of chronic renal disease.    Past Medical History:  Diagnosis Date   Fibromyalgia    H/O total hysterectomy    Hypercholesteremia    Hypertension      Family History  Problem Relation Age of Onset   Diabetes Mother    Emphysema Father    Breast cancer Paternal Aunt      Current Outpatient Medications:    Albuterol Sulfate 108 (90 Base) MCG/ACT AEPB, Inhale 2 puffs into the lungs every 6 (six) hours as needed (for shortness of breath or cough)., Disp: 1 each, Rfl: 1    buPROPion (WELLBUTRIN XL) 150 MG 24 hr tablet, Take 1 tablet (150 mg total) by mouth every morning., Disp: 90 tablet, Rfl: 1   fluocinonide ointment (LIDEX) 0.05 %, APPLY EXTERNALLY TO THE AFFECTED AREA TWICE DAILY, Disp: 30 g, Rfl: 1   ibuprofen (ADVIL) 800 MG tablet, TAKE 1 TABLET(800 MG) BY MOUTH EVERY 8 HOURS AS NEEDED, Disp: 60 tablet, Rfl: 1   losartan-hydrochlorothiazide (HYZAAR) 50-12.5 MG tablet, TAKE 1 TABLET BY MOUTH DAILY, Disp: 90 tablet, Rfl: 1   nystatin cream (MYCOSTATIN), Apply 1 application topically 2 (two) times daily., Disp: 30 g, Rfl: 0   omeprazole (PRILOSEC) 40 MG capsule, Take 1 capsule by mouth daily., Disp: , Rfl:    Pitavastatin Magnesium 4 MG TABS, Take 1 tablet by mouth every other day., Disp: 30 tablet, Rfl: 11   Tdap (BOOSTRIX) 5-2.5-18.5 LF-MCG/0.5 injection, Inject 0.5 mLs into the muscle once for 1 dose., Disp: 0.5 mL, Rfl: 0   valACYclovir (VALTREX) 500 MG tablet, Take 1 tablet (500 mg total) by mouth 2 (two) times daily., Disp: 90 tablet, Rfl: 1   Vitamin D, Ergocalciferol, (DRISDOL) 1.25 MG (50000 UNIT) CAPS capsule, TAKE 1 CAPSULE BY MOUTH 2 TIMES A WEEK, Disp: 24 capsule, Rfl: 1   Allergies  Allergen Reactions   Aspirin     REACTION: stomach pain   Ceftriaxone Sodium    Codeine     REACTION: shortness of breath   Penicillins     REACTION: hives/skin  rash   Simvastatin     REACTION: muscular pain   Sulfonamide Derivatives     REACTION: hives/skin rash   Trimethoprim     REACTION: Hives/skin rash     Review of Systems  Constitutional: Negative.   Respiratory: Negative.  Negative for shortness of breath.   Cardiovascular: Negative.  Negative for chest pain and palpitations.  Gastrointestinal: Negative.   Neurological: Negative.     Today's Vitals   04/03/21 0841  BP: 130/70  Pulse: 65  Temp: 98.4 F (36.9 C)  TempSrc: Oral  Weight: 197 lb 12.8 oz (89.7 kg)  Height: 5' 3"  (1.6 m)   Body mass index is 35.04 kg/m.   Objective:   Physical Exam Vitals reviewed.  Constitutional:      General: She is not in acute distress.    Appearance: Normal appearance. She is obese.  Cardiovascular:     Rate and Rhythm: Normal rate and regular rhythm.     Pulses: Normal pulses.     Heart sounds: Normal heart sounds. No murmur heard. Pulmonary:     Effort: Pulmonary effort is normal. No respiratory distress.     Breath sounds: Normal breath sounds. No wheezing.  Skin:    General: Skin is warm and dry.     Capillary Refill: Capillary refill takes less than 2 seconds.  Neurological:     General: No focal deficit present.     Mental Status: She is alert and oriented to person, place, and time.     Cranial Nerves: No cranial nerve deficit.     Motor: No weakness.  Psychiatric:        Mood and Affect: Mood normal.        Behavior: Behavior normal.        Thought Content: Thought content normal.        Judgment: Judgment normal.        Assessment And Plan:     1. Essential hypertension Comments: Blood pressure is well controlled, continue current medications - POCT Urinalysis Dipstick (81002) - POCT UA - Microalbumin - CMP14+EGFR  2. Mixed hyperlipidemia Comments: She will have her lipids checked today has not been done in 1 year, she has recently started pitivistatin magnesium so far tolerating well. She is being followed by Dr. Debara Pickett - CMP14+EGFR - Lipid panel  3. Prediabetes Comments: Stable, no current medications.  - Hemoglobin A1c - CMP14+EGFR  4. Vitamin D deficiency Will check vitamin D level and supplement as needed.    Also encouraged to spend 15 minutes in the sun daily.   5. Class 2 obesity due to excess calories with body mass index (BMI) of 35.0 to 35.9 in adult, unspecified whether serious comorbidity present Chronic Discussed healthy diet limiting processed foods and high carbohydrate type foods and regular exercise options  Encouraged to exercise at least 150 minutes per week with 2 days of  strength training as tolerated.   6. Need for influenza vaccination Influenza vaccine administered Encouraged to take Tylenol as needed for fever or muscle aches. - Flu Vaccine QUAD High Dose(Fluad)  7. Encounter for immunization Will give tetanus vaccine today while in office. Refer to order management. TDAP will be administered to adults 94-64 years old every 10 years. - Tdap (BOOSTRIX) 5-2.5-18.5 LF-MCG/0.5 injection; Inject 0.5 mLs into the muscle once for 1 dose.  Dispense: 0.5 mL; Refill: 0  8. Decreased estrogen level - DG Bone Density; Future     Patient was given opportunity to ask questions.  Patient verbalized understanding of the plan and was able to repeat key elements of the plan. All questions were answered to their satisfaction.  [name], FNP   I, [name], FNP, have reviewed all documentation for this visit. The documentation on 04/03/21 for the exam, diagnosis, procedures, and orders are all accurate and complete.   IF YOU HAVE BEEN REFERRED TO A SPECIALIST, IT MAY TAKE 1-2 WEEKS TO SCHEDULE/PROCESS THE REFERRAL. IF YOU HAVE NOT HEARD FROM US/SPECIALIST IN TWO WEEKS, PLEASE GIVE [place] A CALL AT [phone] X [phone].   THE PATIENT IS ENCOURAGED TO PRACTICE SOCIAL DISTANCING DUE TO THE COVID-19 PANDEMIC.

## 2021-04-04 LAB — HEMOGLOBIN A1C
Est. average glucose Bld gHb Est-mCnc: 137 mg/dL
Hgb A1c MFr Bld: 6.4 % — ABNORMAL HIGH (ref 4.8–5.6)

## 2021-04-04 LAB — CMP14+EGFR
ALT: 13 IU/L (ref 0–32)
AST: 16 IU/L (ref 0–40)
Albumin/Globulin Ratio: 1.8 (ref 1.2–2.2)
Albumin: 4.8 g/dL (ref 3.8–4.8)
Alkaline Phosphatase: 79 IU/L (ref 44–121)
BUN/Creatinine Ratio: 19 (ref 12–28)
BUN: 15 mg/dL (ref 8–27)
Bilirubin Total: 0.5 mg/dL (ref 0.0–1.2)
CO2: 26 mmol/L (ref 20–29)
Calcium: 9.7 mg/dL (ref 8.7–10.3)
Chloride: 101 mmol/L (ref 96–106)
Creatinine, Ser: 0.81 mg/dL (ref 0.57–1.00)
Globulin, Total: 2.6 g/dL (ref 1.5–4.5)
Glucose: 93 mg/dL (ref 70–99)
Potassium: 4.3 mmol/L (ref 3.5–5.2)
Sodium: 140 mmol/L (ref 134–144)
Total Protein: 7.4 g/dL (ref 6.0–8.5)
eGFR: 81 mL/min/{1.73_m2} (ref >59)

## 2021-04-04 LAB — LIPID PANEL
Chol/HDL Ratio: 2.5 ratio (ref 0.0–4.4)
Cholesterol, Total: 197 mg/dL (ref 100–199)
HDL: 79 mg/dL (ref >39)
LDL Chol Calc (NIH): 105 mg/dL — ABNORMAL HIGH (ref 0–99)
Triglycerides: 74 mg/dL (ref 0–149)
VLDL Cholesterol Cal: 13 mg/dL (ref 5–40)

## 2021-04-11 ENCOUNTER — Encounter: Payer: Self-pay | Admitting: Internal Medicine

## 2021-04-25 ENCOUNTER — Telehealth: Payer: Self-pay

## 2021-04-25 NOTE — Telephone Encounter (Signed)
This patient's GYN office (S[facility]) called stating they do not do paps on this patient because she has had a hysterectomy without a cervix. YL,RMA

## 2021-05-02 ENCOUNTER — Encounter: Payer: Self-pay | Admitting: Internal Medicine

## 2021-05-02 ENCOUNTER — Other Ambulatory Visit: Payer: Self-pay | Admitting: Nurse Practitioner

## 2021-05-02 DIAGNOSIS — M797 Fibromyalgia: Secondary | ICD-10-CM

## 2021-05-02 DIAGNOSIS — F331 Major depressive disorder, recurrent, moderate: Secondary | ICD-10-CM

## 2021-06-21 ENCOUNTER — Other Ambulatory Visit: Payer: Self-pay

## 2021-06-21 ENCOUNTER — Ambulatory Visit (INDEPENDENT_AMBULATORY_CARE_PROVIDER_SITE_OTHER): Payer: Medicare Other | Admitting: Nurse Practitioner

## 2021-06-21 ENCOUNTER — Encounter: Payer: Self-pay | Admitting: Nurse Practitioner

## 2021-06-21 VITALS — BP 128/72 | HR 83 | Temp 98.3°F | Ht 63.0 in | Wt 197.0 lb

## 2021-06-21 DIAGNOSIS — R21 Rash and other nonspecific skin eruption: Secondary | ICD-10-CM

## 2021-06-21 MED ORDER — PREDNISONE 10 MG (21) PO TBPK: ORAL_TABLET | ORAL | 0 refills | Status: DC

## 2021-06-21 MED ORDER — HYDROXYZINE HCL 10 MG PO TABS: 10.0000 mg | ORAL_TABLET | Freq: Three times a day (TID) | ORAL | 0 refills | Status: DC | PRN

## 2021-06-21 NOTE — Progress Notes (Signed)
?Industrial/product designer as a Education administrator for [employer], FNP.,have documented all relevant documentation on the behalf of [name], FNP,as directed by  [name], FNP while in the presence of [name], [place]. ? ?This visit occurred during the SARS-CoV-2 public health emergency.  Safety protocols were in place, including screening questions prior to the visit, additional usage of staff PPE, and extensive cleaning of exam room while observing appropriate contact time as indicated for disinfecting solutions. ? ?Subjective:  ?  ? Patient ID: Margaret Hill , female    DOB: 16-May-1955 , 66 y.o.   MRN: [id number] ? ? ?Chief Complaint  ?Patient presents with  ? Allergic Reaction  ? ? ?HPI ? ?Patient presents today for treatment for a rash. She states that this started Sunday.  ? ?Allergic Reaction ?This is a recurrent problem. The current episode started 3 to 5 days ago. The problem occurs constantly. Associated symptoms include a rash (arms, chest and abdomen). There is no swelling present. There is no history of asthma.   ? ?Past Medical History:  ?Diagnosis Date  ? Fibromyalgia   ? H/O total hysterectomy   ? Hypercholesteremia   ? Hypertension   ?  ? ?Family History  ?Problem Relation Age of Onset  ? Diabetes Mother   ? Emphysema Father   ? Breast cancer Paternal Aunt   ? ? ? ?Current Outpatient Medications:  ?  Albuterol Sulfate 108 (90 Base) MCG/ACT AEPB, Inhale 2 puffs into the lungs every 6 (six) hours as needed (for shortness of breath or cough)., Disp: 1 each, Rfl: 1 ?  buPROPion (WELLBUTRIN XL) 150 MG 24 hr tablet, TAKE 1 TABLET(150 MG) BY MOUTH EVERY MORNING, Disp: 90 tablet, Rfl: 1 ?  fluocinonide ointment (LIDEX) 0.05 %, APPLY EXTERNALLY TO THE AFFECTED AREA TWICE DAILY, Disp: 30 g, Rfl: 1 ?  hydrOXYzine (ATARAX) 10 MG tablet, Take 1 tablet (10 mg total) by mouth 3 (three) times daily as needed., Disp: 30 tablet, Rfl: 0 ?  ibuprofen (ADVIL) 800 MG tablet, TAKE 1 TABLET(800 MG) BY MOUTH EVERY 8 HOURS AS  NEEDED, Disp: 60 tablet, Rfl: 1 ?  losartan-hydrochlorothiazide (HYZAAR) 50-12.5 MG tablet, TAKE 1 TABLET BY MOUTH DAILY, Disp: 90 tablet, Rfl: 1 ?  nystatin cream (MYCOSTATIN), Apply 1 application topically 2 (two) times daily., Disp: 30 g, Rfl: 0 ?  omeprazole (PRILOSEC) 40 MG capsule, Take 1 capsule by mouth daily., Disp: , Rfl:  ?  predniSONE (STERAPRED UNI-PAK 21 TAB) 10 MG (21) TBPK tablet, Take as directed, Disp: 21 tablet, Rfl: 0 ?  valACYclovir (VALTREX) 500 MG tablet, Take 1 tablet (500 mg total) by mouth 2 (two) times daily., Disp: 90 tablet, Rfl: 1 ?  Vitamin D, Ergocalciferol, (DRISDOL) 1.25 MG (50000 UNIT) CAPS capsule, TAKE 1 CAPSULE BY MOUTH 2 TIMES A WEEK, Disp: 24 capsule, Rfl: 1  ? ?Allergies  ?Allergen Reactions  ? Aspirin   ?  REACTION: stomach pain  ? Ceftriaxone Sodium   ? Codeine   ?  REACTION: shortness of breath  ? Penicillins   ?  REACTION: hives/skin rash  ? Simvastatin   ?  REACTION: muscular pain  ? Sulfonamide Derivatives   ?  REACTION: hives/skin rash  ? Trimethoprim   ?  REACTION: Hives/skin rash  ?  ? ?Review of Systems  ?Constitutional: Negative.   ?Respiratory: Negative.    ?Cardiovascular: Negative.   ?Gastrointestinal: Negative.   ?Skin:  Positive for rash (arms, chest and abdomen).  ?Neurological: Negative.   ?  Psychiatric/Behavioral: Negative.     ? ?Today's Vitals  ? 06/21/21 1001  ?BP: 128/72  ?Pulse: 83  ?Temp: 98.3 ?F (36.8 ?C)  ?TempSrc: Oral  ?Weight: 197 lb (89.4 kg)  ?Height: '5\' 3"'$  (1.6 m)  ? ?Body mass index is 34.9 kg/m?.  ? ?Objective:  ?Physical Exam ?Vitals reviewed.  ?Constitutional:   ?   General: She is not in acute distress. ?   Appearance: Normal appearance.  ?Cardiovascular:  ?   Rate and Rhythm: Normal rate and regular rhythm.  ?   Pulses: Normal pulses.  ?   Heart sounds: Normal heart sounds. No murmur heard. ?Pulmonary:  ?   Effort: Pulmonary effort is normal. No respiratory distress.  ?   Breath sounds: Normal breath sounds. No wheezing.  ?Skin: ?    General: Skin is warm.  ?   Findings: Erythema and rash (prickly, red rash to arms, back and abdomen) present.  ?Neurological:  ?   General: No focal deficit present.  ?   Mental Status: She is alert and oriented to person, place, and time.  ?   Cranial Nerves: No cranial nerve deficit.  ?   Motor: No weakness.  ?Psychiatric:     ?   Mood and Affect: Mood normal.     ?   Behavior: Behavior normal.     ?   Thought Content: Thought content normal.     ?   Judgment: Judgment normal.  ?  ? ?   ?Assessment And Plan:  ?   ?1. Rash and nonspecific skin eruption ?Comments: Prickly, red rash to arms, stomach and back.  Prednisone taper given and she is to take atarax at bedtime, avoid driving or operating heavy machinery when taking ?- predniSONE (STERAPRED UNI-PAK 21 TAB) 10 MG (21) TBPK tablet; Take as directed  Dispense: 21 tablet; Refill: 0 ?- hydrOXYzine (ATARAX) 10 MG tablet; Take 1 tablet (10 mg total) by mouth 3 (three) times daily as needed.  Dispense: 30 tablet; Refill: 0 ?  ? ? ?Patient was given opportunity to ask questions. Patient verbalized understanding of the plan and was able to repeat key elements of the plan. All questions were answered to their satisfaction.  ?[name], FNP  ? ?I, [name], FNP, have reviewed all documentation for this visit. The documentation on 06/21/21 for the exam, diagnosis, procedures, and orders are all accurate and complete.  ? ?IF YOU HAVE BEEN REFERRED TO A SPECIALIST, IT MAY TAKE 1-2 WEEKS TO SCHEDULE/PROCESS THE REFERRAL. IF YOU HAVE NOT HEARD FROM US/SPECIALIST IN TWO WEEKS, PLEASE GIVE [place] A CALL AT [phone].  ? ?THE PATIENT IS ENCOURAGED TO PRACTICE SOCIAL DISTANCING DUE TO THE COVID-19 PANDEMIC.   ?

## 2021-06-21 NOTE — Patient Instructions (Signed)
Allergies, Adult ?An allergy is a condition in which the body's defense system (immune system) comes in contact with an allergen and reacts to it. An allergen is anything that causes an allergic reaction. Allergens cause the immune system to make proteins for fighting infections (antibodies). These antibodies cause cells to release chemicals called histamines that set off the symptoms of an allergic reaction. ?Allergies often affect the nasal passages (allergic rhinitis), eyes (allergic conjunctivitis), skin (atopic dermatitis), and stomach. Allergies can be mild, moderate, or severe. They cannot spread from person to person. Allergies can develop at any age and may be outgrown. ?What are the causes? ?This condition is caused by allergens. Common allergens include: ?Outdoor allergens, such as pollen, car fumes, and mold. ?Indoor allergens, such as dust, smoke, mold, and pet dander. ?Other allergens, such as foods, medicines, scents, insect bites or stings, and other skin irritants. ?What increases the risk? ?You are more likely to develop this condition if you have: ?Family members with allergies. ?Family members who have any condition that may be caused by allergens, such as asthma. This may make you more likely to have other allergies. ?What are the signs or symptoms? ?Symptoms of this condition depend on the severity of the allergy. ?Mild to moderate symptoms ?Runny nose, stuffy nose (nasal congestion), or sneezing. ?Itchy mouth, ears, or throat. ?A feeling of mucus dripping down the back of your throat (postnasal drip). ?Sore throat. ?Itchy, red, watery, or puffy eyes. ?Skin rash, or itchy, red, swollen areas of skin (hives). ?Stomach cramps or bloating. ?Severe symptoms ?Severe allergies to food, medicine, or insect bites may cause anaphylaxis, which can be life-threatening. Symptoms include: ?A red (flushed) face. ?Wheezing or coughing. ?Swollen lips, tongue, or mouth. ?Tight or swollen throat. ?Chest pain or  tightness, or rapid heartbeat. ?Trouble breathing or shortness of breath. ?Pain in the abdomen, vomiting, or diarrhea. ?Dizziness or fainting. ?How is this diagnosed? ?This condition is diagnosed based on your symptoms, your family and medical history, and a physical exam. You may also have tests, including: ?Skin tests to see how your skin reacts to allergens that may be causing your symptoms. Tests include: ?Skin prick test. For this test, an allergen is introduced to your body through a small opening in the skin. ?Intradermal skin test. For this test, a small amount of allergen is injected under the first layer of your skin. ?Patch test. For this test, a small amount of allergen is placed on your skin. The area is covered and then checked after a few days. ?Blood tests. ?A challenge test. For this test, you will eat or breathe in a small amount of allergen to see if you have an allergic reaction. ?You may also be asked to: ?Keep a food diary. This is a record of all the foods, drinks, and symptoms you have in a day. ?Try an elimination diet. To do this: ?Remove certain foods from your diet. ?Add those foods back one by one to find out if any foods cause an allergic reaction. ?How is this treated? ?  ?Treatment for allergies depends on your symptoms. Treatment may include: ?Cold, wet cloths (cold compresses) to soothe itching and swelling. ?Eye drops or nasal sprays. ?Nasal irrigation to help clear your mucus or keep the nasal passages moist. ?A humidifier to add moisture to the air. ?Skin creams to treat rashes or itching. ?Oral antihistamines or other medicines to block the reaction or to treat inflammation. ?Diet changes to remove foods that cause allergies. ?Being exposed again  and again to tiny amounts of allergens to help you build a defense against it (tolerance). This is called immunotherapy. Examples include: ?Allergy shot. You receive an injection that contains an allergen. ?Sublingual immunotherapy. You  take a small dose of allergen under your tongue. ?Emergency injection for anaphylaxis. You give yourself a shot using a syringe (auto-injector) that contains the amount of medicine you need. Your health care provider will teach you how to give yourself an injection. ?Follow these instructions at home: ?Medicines ? ?Take or apply over-the-counter and prescription medicines only as told by your health care provider. ?Always carry your auto-injector pen if you are at risk of anaphylaxis. Give yourself an injection as told by your health care provider. ?Eating and drinking ?Follow instructions from your health care provider about eating or drinking restrictions. ?Drink enough fluid to keep your urine pale yellow. ?General instructions ?Wear a medical alert bracelet or necklace to let others know that you have had anaphylaxis before. ?Avoid known allergens whenever possible. ?Keep all follow-up visits as told by your health care provider. This is important. ?Contact a health care provider if: ?Your symptoms do not get better with treatment. ?Get help right away if: ?You have symptoms of anaphylaxis. These include: ?Swollen mouth, tongue, or throat. ?Pain or tightness in your chest. ?Trouble breathing or shortness of breath. ?Dizziness or fainting. ?Severe abdominal pain, vomiting, or diarrhea. ?These symptoms may represent a serious problem that is an emergency. Do not wait to see if the symptoms will go away. Get medical help right away. Call your local emergency services (911 in the [place] Do not drive yourself to the hospital. ?Summary ?Take or apply over-the-counter and prescription medicines only as told by your health care provider. ?Avoid known allergens when possible. ?Always carry your auto-injector pen if you are at risk of anaphylaxis. Give yourself an injection as told by your health care provider. ?Wear a medical alert bracelet or necklace to let others know that you have had anaphylaxis before. ?Anaphylaxis  is a life-threatening emergency. Get help right away. ?This information is not intended to replace advice given to you by your health care provider. Make sure you discuss any questions you have with your health care provider. ?Document Revised: 11/29/2019 Document Reviewed: 02/10/2019 ?Elsevier Patient Education ? [place]. ? ?

## 2021-07-10 ENCOUNTER — Ambulatory Visit: Payer: Medicare Other | Admitting: Nurse Practitioner

## 2021-07-12 LAB — LIPID PANEL
Chol/HDL Ratio: 3 ratio (ref 0.0–4.4)
Cholesterol, Total: 257 mg/dL — ABNORMAL HIGH (ref 100–199)
HDL: 87 mg/dL (ref >39)
LDL Chol Calc (NIH): 158 mg/dL — ABNORMAL HIGH (ref 0–99)
Triglycerides: 71 mg/dL (ref 0–149)
VLDL Cholesterol Cal: 12 mg/dL (ref 5–40)

## 2021-07-19 ENCOUNTER — Ambulatory Visit (INDEPENDENT_AMBULATORY_CARE_PROVIDER_SITE_OTHER): Payer: Medicare Other | Admitting: Internal Medicine

## 2021-07-19 ENCOUNTER — Encounter: Payer: Self-pay | Admitting: Internal Medicine

## 2021-07-19 VITALS — BP 132/76 | HR 88 | Ht 62.0 in | Wt 197.0 lb

## 2021-07-19 DIAGNOSIS — Z789 Other specified health status: Secondary | ICD-10-CM

## 2021-07-19 DIAGNOSIS — M797 Fibromyalgia: Secondary | ICD-10-CM | POA: Diagnosis not present

## 2021-07-19 DIAGNOSIS — E785 Hyperlipidemia, unspecified: Secondary | ICD-10-CM | POA: Diagnosis not present

## 2021-07-19 MED ORDER — PITAVASTATIN MAGNESIUM 4 MG PO TABS: 1.0000 | ORAL_TABLET | ORAL | 11 refills | Status: DC

## 2021-07-19 NOTE — Progress Notes (Signed)
? ? ? ?LIPID CLINIC CONSULT NOTE ? ?Chief Complaint:  ?Follow-up dyslipidemia ? ?Primary Care Physician: ?[name], FNP ? ?Primary Cardiologist:  ?None ? ?HPI:  ?Margaret Hill is a 66 y.o. female who is being seen today for the evaluation of dyslipidemia at the request of [name], [place].  This is a pleasant 66 year old female with a history of fibromyalgia intolerance, dyslipidemia, hypertension and family history of heart disease including her father who had a stent in his 57s, mother with diabetes, and brother who had heart disease and a pacemaker in his 30s.  She has longstanding elevated cholesterol but had difficulty taking statins because it worsened her fibromyalgia.  More recently she was able to take Livalo 4 mg every other day.  Unfortunately her insurance did not cover it the cost is becoming prohibitive.  She has received samples from her PCP.  Recent labs on therapy in December 2021 showed total cholesterol 178, HDL 71, triglycerides 52 and LDL 97.  This is reasonable treatment however of course given her family history her targets may need to be lower.  We discussed the possibility of further coronary evaluation with calcium scoring. ? ?07/19/2021 ? ?Ms. Margaret Hill returns today for follow-up of her dyslipidemia.  She had messaged [place] that after switching to [facility], she noted that she started having some worsening fibromyalgia.  Previously she had taken the Livalo 4 mg every other day but had been taking the Zypitamag every day.  She was able to get it through [facility] in [place] and a much lower cost.  I am not sure that this actually caused her worsening fibromyalgia and she is not convinced either.  Her cholesterol however was lower and then when she came off of medication now has recently resulted with LDL back up to 158 from 97.  She agrees she will need additional therapy.  She is interested in restarting the medication but every other day. ? ?PMHx:  ?Past Medical History:  ?Diagnosis  Date  ? Fibromyalgia   ? H/O total hysterectomy   ? Hypercholesteremia   ? Hypertension   ? ? ?Past Surgical History:  ?Procedure Laterality Date  ? BILATERAL TEMPOROMANDIBULAR JOINT ARTHROPLASTY  1990  ? GALLBLADDER SURGERY  1983  ? PARTIAL HYSTERECTOMY  1991  ? ? ?FAMHx:  ?Family History  ?Problem Relation Age of Onset  ? Diabetes Mother   ? Emphysema Father   ? Breast cancer Paternal Aunt   ? ? ?SOCHx:  ? reports that she has never smoked. She has never used smokeless tobacco. She reports that she does not currently use alcohol. She reports that she does not use drugs. ? ?ALLERGIES:  ?Allergies  ?Allergen Reactions  ? Aspirin   ?  REACTION: stomach pain  ? Ceftriaxone Sodium   ? Codeine   ?  REACTION: shortness of breath  ? Latex   ? Penicillins   ?  REACTION: hives/skin rash  ? Simvastatin   ?  REACTION: muscular pain  ? Sulfonamide Derivatives   ?  REACTION: hives/skin rash  ? Trimethoprim   ?  REACTION: Hives/skin rash  ? ? ?ROS: ?Pertinent items noted in HPI and remainder of comprehensive ROS otherwise negative. ? ?HOME MEDS: ?Current Outpatient Medications on File Prior to Visit  ?Medication Sig Dispense Refill  ? Albuterol Sulfate 108 (90 Base) MCG/ACT AEPB Inhale 2 puffs into the lungs every 6 (six) hours as needed (for shortness of breath or cough). 1 each 1  ? buPROPion (WELLBUTRIN XL)  150 MG 24 hr tablet TAKE 1 TABLET(150 MG) BY MOUTH EVERY MORNING 90 tablet 1  ? fluocinonide ointment (LIDEX) 0.05 % APPLY EXTERNALLY TO THE AFFECTED AREA TWICE DAILY 30 g 1  ? hydrOXYzine (ATARAX) 10 MG tablet Take 1 tablet (10 mg total) by mouth 3 (three) times daily as needed. 30 tablet 0  ? ibuprofen (ADVIL) 800 MG tablet TAKE 1 TABLET(800 MG) BY MOUTH EVERY 8 HOURS AS NEEDED 60 tablet 1  ? nystatin cream (MYCOSTATIN) Apply 1 application topically 2 (two) times daily. 30 g 0  ? omeprazole (PRILOSEC) 40 MG capsule Take 1 capsule by mouth daily.    ? valACYclovir (VALTREX) 500 MG tablet Take 1 tablet (500 mg total) by  mouth 2 (two) times daily. 90 tablet 1  ? Vitamin D, Ergocalciferol, (DRISDOL) 1.25 MG (50000 UNIT) CAPS capsule TAKE 1 CAPSULE BY MOUTH 2 TIMES A WEEK 24 capsule 1  ? losartan-hydrochlorothiazide (HYZAAR) 50-12.5 MG tablet TAKE 1 TABLET BY MOUTH DAILY (Patient not taking: Reported on 07/19/2021) 90 tablet 1  ? predniSONE (STERAPRED UNI-PAK 21 TAB) 10 MG (21) TBPK tablet Take as directed (Patient not taking: Reported on 07/19/2021) 21 tablet 0  ? ?No current facility-administered medications on file prior to visit.  ? ? ?LABS/IMAGING: ?No results found for this or any previous visit (from the past 48 hour(s)). ?No results found. ? ?LIPID PANEL: ?   ?Component Value Date/Time  ? CHOL 257 (H) 07/11/2021 0843  ? TRIG 71 07/11/2021 0843  ? HDL 87 07/11/2021 0843  ? CHOLHDL 3.0 07/11/2021 0843  ? CHOLHDL 2.2 Ratio 01/22/2008 2254  ? VLDL 12 01/22/2008 2254  ? [place] 158 (H) 07/11/2021 0843  ? ? ?WEIGHTS: ?Wt Readings from Last 3 Encounters:  ?07/19/21 197 lb (89.4 kg)  ?06/21/21 197 lb (89.4 kg)  ?04/03/21 197 lb 12.8 oz (89.7 kg)  ? ? ?VITALS: ?BP 132/76   Pulse 88   Ht '5\' 2"'$  (1.575 m)   Wt 197 lb (89.4 kg)   SpO2 97%   BMI 36.03 kg/m?  ? ?EXAM: ?Deferred ? ?EKG: ?Deferred ? ?ASSESSMENT: ?Mixed dyslipidemia ?Statin intolerant ?Hypertension ?Family history of premature coronary disease ?Fibromyalgia ? ?PLAN: ?1.   Ms. Margaret Hill reported possible worsening of her fibromyalgia on Zyptimag, but this may have just been coincidental.  She had been taking the medicine every day versus every other day.  She is agreeable to restarting it on an every other day basis and if tolerated we will repeat lipids in a few months.  Follow-up with me in 6 months. ? ?Pixie Casino, MD, [facility], FACP  ?[place]  ?Medical Director of the Advanced Lipid Disorders &  ?Cardiovascular Risk Reduction Clinic ?Diplomate of the [employer] of Clinical Lipidology ?Attending Cardiologist  ?Direct Dial: [phone]  Fax:  [phone]  ?Website:  [email or web address] ? ?Nadean Corwin Taishaun Levels ?07/19/2021, 9:06 AM  ? ?

## 2021-07-19 NOTE — Patient Instructions (Addendum)
Medication Instructions:  ?RESUME Zyptimag '4mg'$  every other day  ? ?Contact office if not tolerated  ? ?*If you need a refill on your cardiac medications before your next appointment, please call your pharmacy* ? ? ?Lab Work: ?FASTING lab work in 6 months ? ?If you have labs (blood work) drawn today and your tests are completely normal, you will receive your results only by: ?MyChart Message (if you have MyChart) OR ?A paper copy in the mail ?If you have any lab test that is abnormal or we need to change your treatment, we will call you to review the results. ? ? ?Follow-Up: ?At [facility], you and your health needs are our priority.  As part of our continuing mission to provide you with exceptional heart care, we have created designated Provider Care Teams.  These Care Teams include your primary Cardiologist (physician) and Advanced Practice Providers (APPs -  Physician Assistants and Nurse Practitioners) who all work together to provide you with the care you need, when you need it. ? ?We recommend signing up for the patient portal called "MyChart".  Sign up information is provided on this After Visit Summary.  MyChart is used to connect with patients for Virtual Visits (Telemedicine).  Patients are able to view lab/test results, encounter notes, upcoming appointments, etc.  Non-urgent messages can be sent to your provider as well.   ?To learn more about what you can do with MyChart, go to [email or web address].   ? ?Your next appointment:   ?6 months with Dr. Debara Pickett -- lipid clinic ?-- call in May for October 2023 visit ? ? ?

## 2021-08-02 ENCOUNTER — Encounter: Payer: Self-pay | Admitting: Internal Medicine

## 2021-09-03 ENCOUNTER — Other Ambulatory Visit: Payer: Self-pay

## 2021-09-03 DIAGNOSIS — I10 Essential (primary) hypertension: Secondary | ICD-10-CM

## 2021-09-03 MED ORDER — LOSARTAN POTASSIUM-HCTZ 50-12.5 MG PO TABS: 1.0000 | ORAL_TABLET | Freq: Every day | ORAL | 1 refills | Status: DC

## 2021-09-10 ENCOUNTER — Encounter: Payer: Self-pay | Admitting: Internal Medicine

## 2021-09-10 DIAGNOSIS — Z79899 Other long term (current) drug therapy: Secondary | ICD-10-CM

## 2021-09-10 DIAGNOSIS — E785 Hyperlipidemia, unspecified: Secondary | ICD-10-CM

## 2021-09-13 MED ORDER — EZETIMIBE 10 MG PO TABS: 10.0000 mg | ORAL_TABLET | Freq: Every day | ORAL | 6 refills | Status: DC

## 2021-09-17 ENCOUNTER — Ambulatory Visit
Admission: RE | Admit: 2021-09-17 | Discharge: 2021-09-17 | Disposition: A | Discharge status: Home / Self Care | Payer: Medicare Other | Source: Ambulatory Visit | Attending: Nurse Practitioner | Admitting: Nurse Practitioner

## 2021-09-17 DIAGNOSIS — E2839 Other primary ovarian failure: Secondary | ICD-10-CM

## 2021-09-27 ENCOUNTER — Other Ambulatory Visit: Payer: Self-pay

## 2021-09-27 MED ORDER — FLUOCINONIDE 0.05 % EX OINT: TOPICAL_OINTMENT | CUTANEOUS | 1 refills | Status: AC

## 2021-10-10 ENCOUNTER — Ambulatory Visit: Payer: Medicare Other | Admitting: Nurse Practitioner

## 2021-10-25 ENCOUNTER — Encounter (HOSPITAL_BASED_OUTPATIENT_CLINIC_OR_DEPARTMENT_OTHER): Payer: Self-pay | Admitting: Internal Medicine

## 2021-11-12 ENCOUNTER — Other Ambulatory Visit: Payer: Self-pay

## 2021-11-12 DIAGNOSIS — M797 Fibromyalgia: Secondary | ICD-10-CM

## 2021-11-12 DIAGNOSIS — F331 Major depressive disorder, recurrent, moderate: Secondary | ICD-10-CM

## 2021-11-12 MED ORDER — BUPROPION HCL ER (XL) 150 MG PO TB24: ORAL_TABLET | ORAL | 1 refills | Status: AC

## 2021-12-21 IMAGING — MG MM DIGITAL SCREENING BILAT W/ TOMO AND CAD
8 series · 8 of 24 positions shown · non-contrast
Comparison: Previous exam(s).

CLINICAL DATA: Screening.

EXAM:
DIGITAL SCREENING BILATERAL MAMMOGRAM WITH TOMOSYNTHESIS AND CAD
TECHNIQUE: Bilateral screening digital craniocaudal and mediolateral oblique
mammograms were obtained. Bilateral screening digital breast
tomosynthesis was performed. The images were evaluated with
computer-aided detection.

[R CC synth-2D]
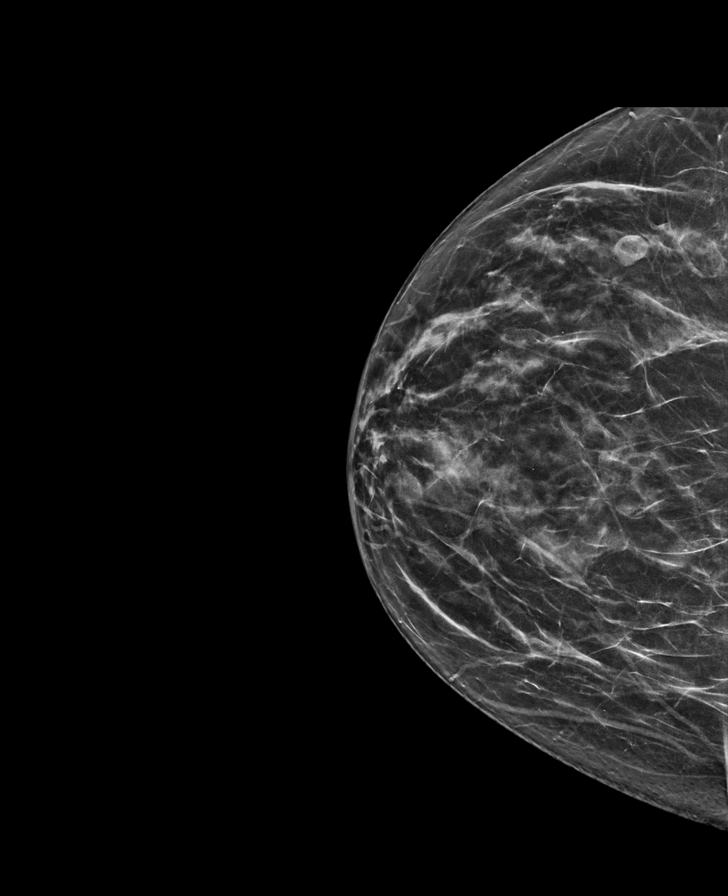

[L MLO synth-2D]
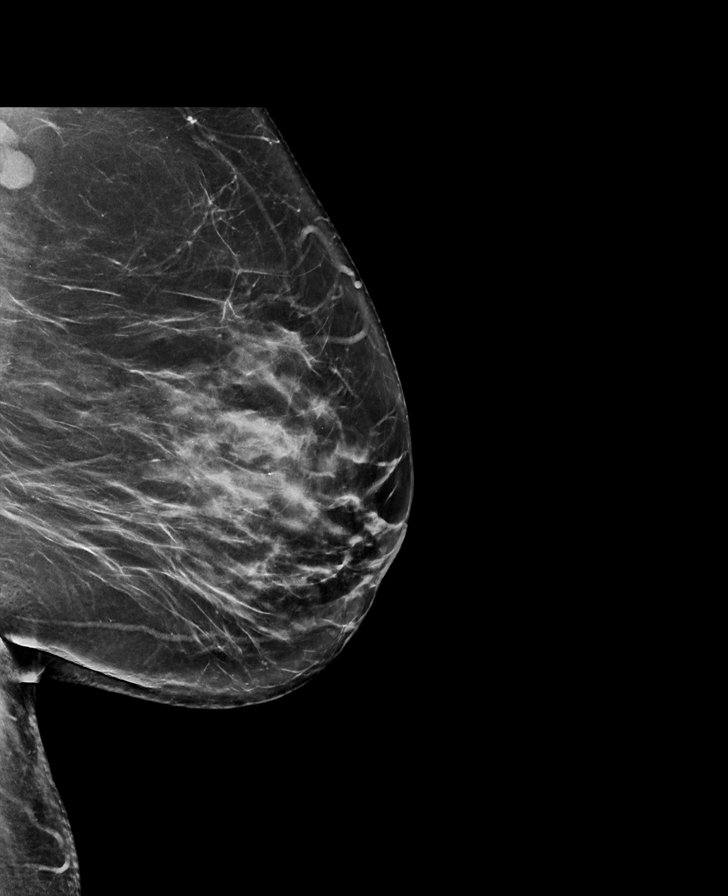

[R MLO synth-2D]
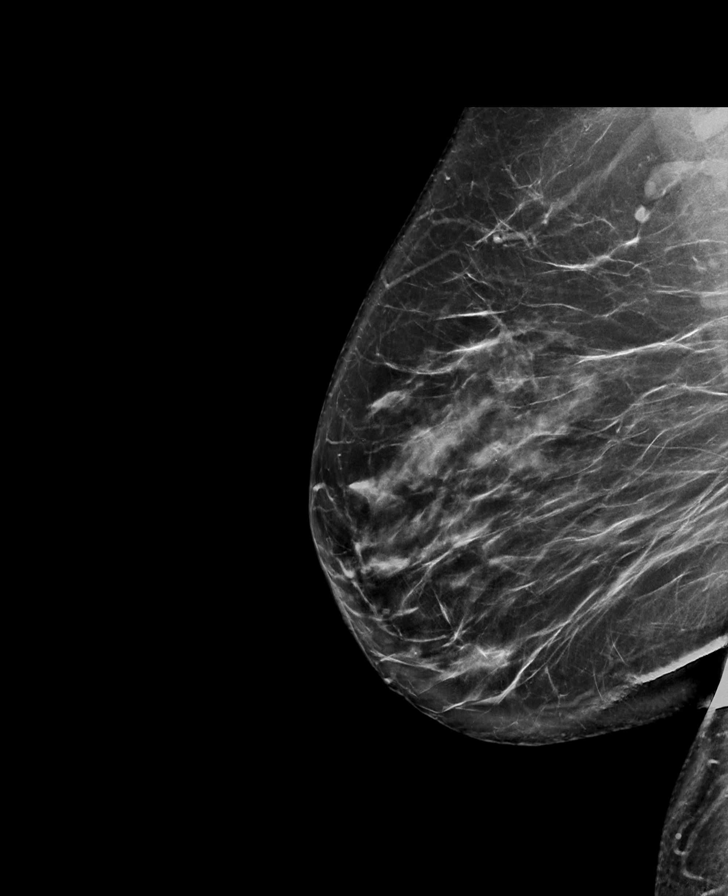

[L CC synth-2D]
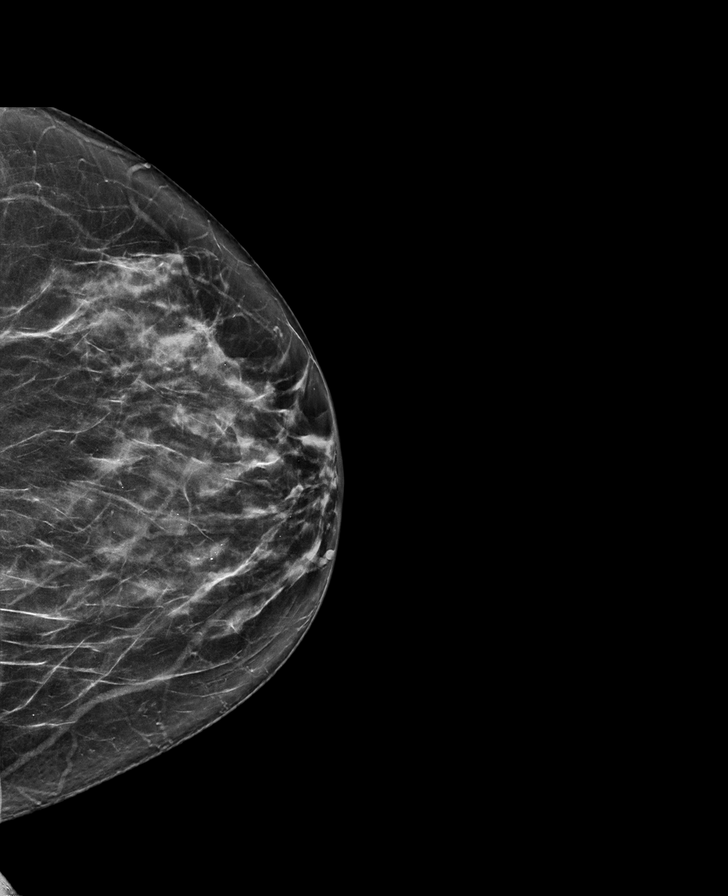

[L CC tomo · tomo slice 35/70.0]
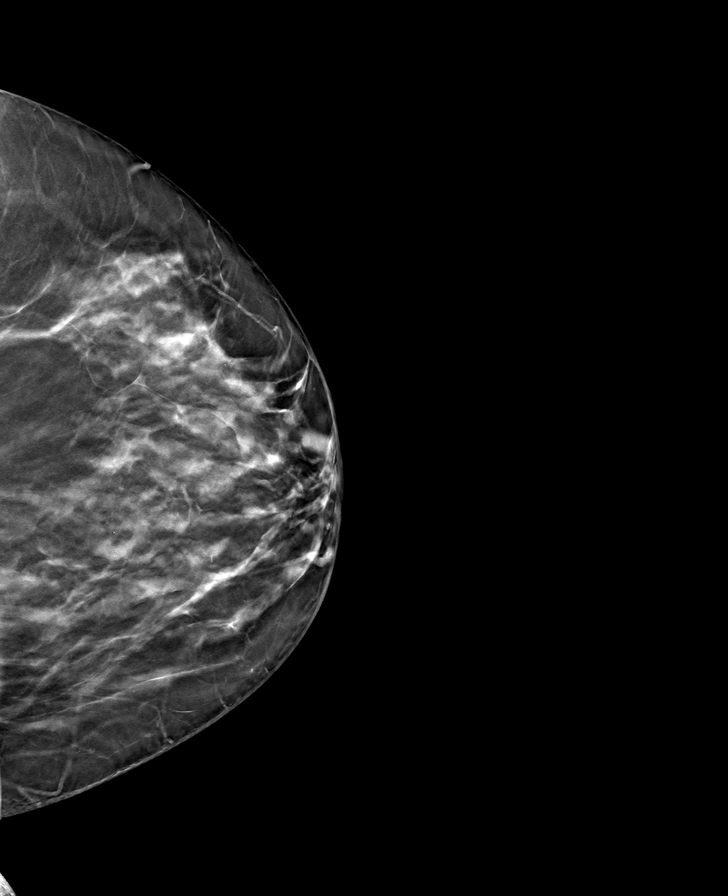

[R CC tomo · tomo slice 33/65.0]
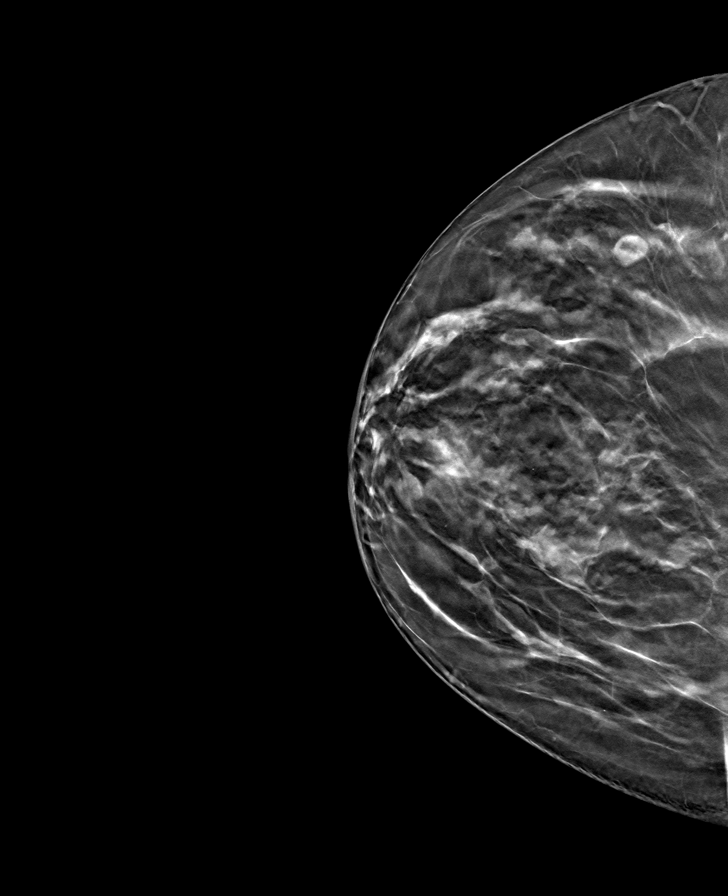

[L MLO tomo · tomo slice 44/87.0]
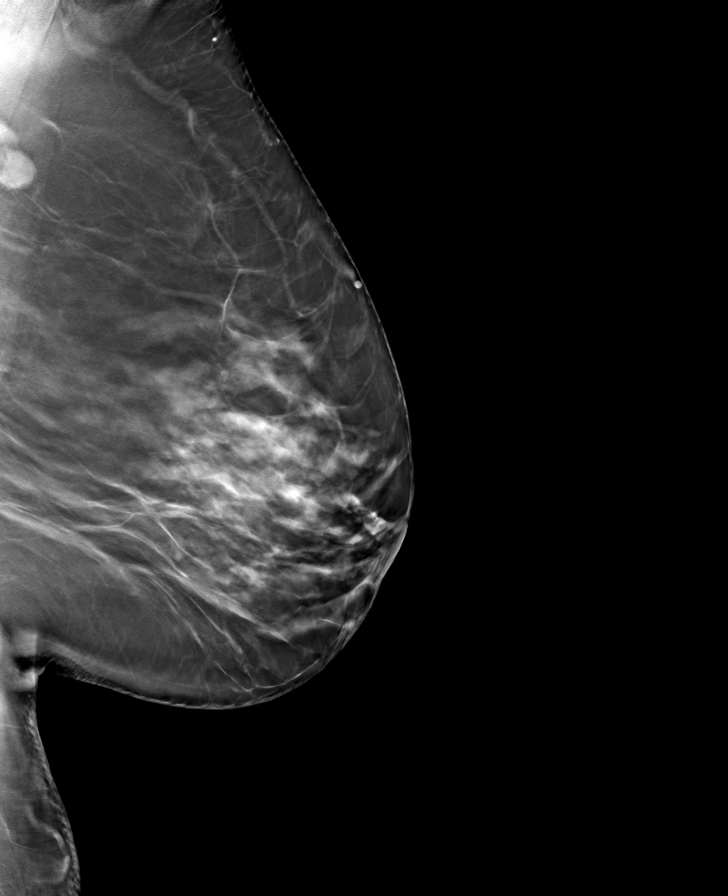

[R MLO tomo · tomo slice 44/87.0]
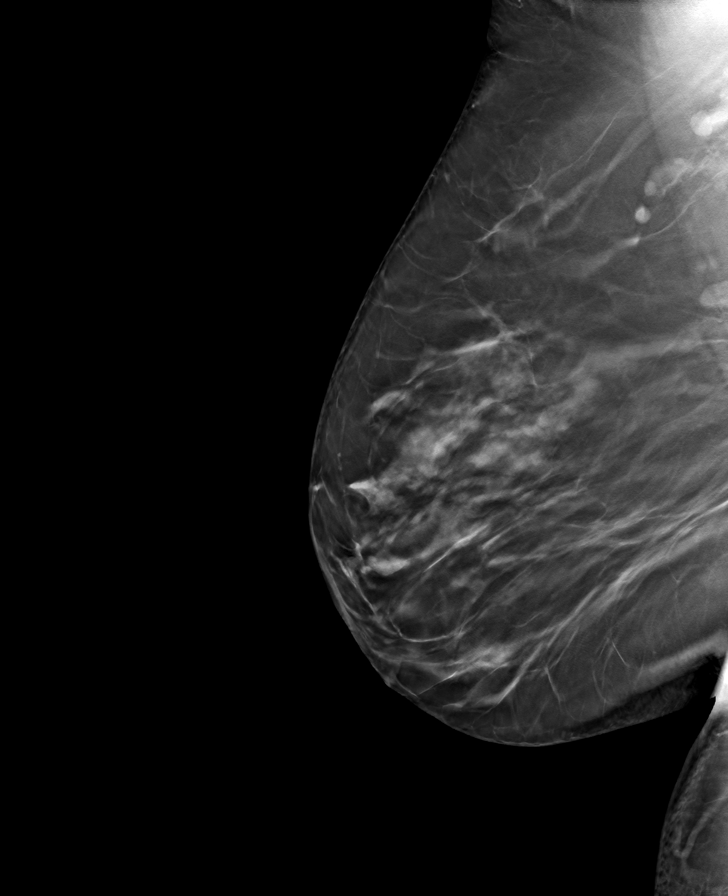

[8 of 24 positions shown; findings below may reference images not displayed]

ACR Breast Density Category c: The breast tissue is heterogeneously
dense, which may obscure small masses.
FINDINGS: There are no findings suspicious for malignancy.
IMPRESSION: No mammographic evidence of malignancy. A result letter of this
screening mammogram will be mailed directly to the patient.

RECOMMENDATION:
Screening mammogram in one year. (Code:[id number])

BI-RADS CATEGORY  1: Negative.

## 2022-01-18 ENCOUNTER — Ambulatory Visit: Payer: Medicare Other | Admitting: Internal Medicine

## 2022-01-24 ENCOUNTER — Ambulatory Visit (INDEPENDENT_AMBULATORY_CARE_PROVIDER_SITE_OTHER): Payer: Medicare Other

## 2022-01-24 VITALS — BP 130/80 | HR 64 | Temp 97.8°F | Ht 64.0 in | Wt 195.8 lb

## 2022-01-24 DIAGNOSIS — Z0000 Encounter for general adult medical examination without abnormal findings: Secondary | ICD-10-CM

## 2022-01-24 NOTE — Patient Instructions (Signed)
Margaret Hill , Thank you for taking time to come for your Medicare Wellness Visit. I appreciate your ongoing commitment to your health goals. Please review the following plan we discussed and let me know if I can assist you in the future.   Screening recommendations/referrals: Colonoscopy: completed 07/14/2019 Mammogram: completed 03/19/2021, due 03/20/2022 Bone Density: completed 09/17/2021 Recommended yearly ophthalmology/optometry visit for glaucoma screening and checkup Recommended yearly dental visit for hygiene and checkup  Vaccinations: Influenza vaccine: checking pharmacy Pneumococcal vaccine: completed 11/08/2020 Tdap vaccine: checking pharmacy Shingles vaccine: discussed   Covid-19: 03/30/2020, 07/27/2019, 07/06/2019  Advanced directives: Advance directive discussed with you today. Even though you declined this today please call our office should you change your mind and we can give you the proper paperwork for you to fill out.   Conditions/risks identified: none  Next appointment: Follow up in one year for your annual wellness visit    Preventive Care 65 Years and Older, Female Preventive care refers to lifestyle choices and visits with your health care provider that can promote health and wellness. What does preventive care include? A yearly physical exam. This is also called an annual well check. Dental exams once or twice a year. Routine eye exams. Ask your health care provider how often you should have your eyes checked. Personal lifestyle choices, including: Daily care of your teeth and gums. Regular physical activity. Eating a healthy diet. Avoiding tobacco and drug use. Limiting alcohol use. Practicing safe sex. Taking low-dose aspirin every day. Taking vitamin and mineral supplements as recommended by your health care provider. What happens during an annual well check? The services and screenings done by your health care provider during your annual well check will  depend on your age, overall health, lifestyle risk factors, and family history of disease. Counseling  Your health care provider may ask you questions about your: Alcohol use. Tobacco use. Drug use. Emotional well-being. Home and relationship well-being. Sexual activity. Eating habits. History of falls. Memory and ability to understand (cognition). Work and work Statistician. Reproductive health. Screening  You may have the following tests or measurements: Height, weight, and BMI. Blood pressure. Lipid and cholesterol levels. These may be checked every 5 years, or more frequently if you are over 6 years old. Skin check. Lung cancer screening. You may have this screening every year starting at age 19 if you have a 30-pack-year history of smoking and currently smoke or have quit within the past 15 years. Fecal occult blood test (FOBT) of the stool. You may have this test every year starting at age 35. Flexible sigmoidoscopy or colonoscopy. You may have a sigmoidoscopy every 5 years or a colonoscopy every 10 years starting at age 48. Hepatitis C blood test. Hepatitis B blood test. Sexually transmitted disease (STD) testing. Diabetes screening. This is done by checking your blood sugar (glucose) after you have not eaten for a while (fasting). You may have this done every 1-3 years. Bone density scan. This is done to screen for osteoporosis. You may have this done starting at age 105. Mammogram. This may be done every 1-2 years. Talk to your health care provider about how often you should have regular mammograms. Talk with your health care provider about your test results, treatment options, and if necessary, the need for more tests. Vaccines  Your health care provider may recommend certain vaccines, such as: Influenza vaccine. This is recommended every year. Tetanus, diphtheria, and acellular pertussis (Tdap, Td) vaccine. You may need a Td booster every 10  years. Zoster vaccine. You may  need this after age 34. Pneumococcal 13-valent conjugate (PCV13) vaccine. One dose is recommended after age 48. Pneumococcal polysaccharide (PPSV23) vaccine. One dose is recommended after age 57. Talk to your health care provider about which screenings and vaccines you need and how often you need them. This information is not intended to replace advice given to you by your health care provider. Make sure you discuss any questions you have with your health care provider. Document Released: 04/28/2015 Document Revised: 12/20/2015 Document Reviewed: 01/31/2015 Elsevier Interactive Patient Education  2017 [place] Prevention in the Home Falls can cause injuries. They can happen to people of all ages. There are many things you can do to make your home safe and to help prevent falls. What can I do on the outside of my home? Regularly fix the edges of walkways and driveways and fix any cracks. Remove anything that might make you trip as you walk through a door, such as a raised step or threshold. Trim any bushes or trees on the path to your home. Use bright outdoor lighting. Clear any walking paths of anything that might make someone trip, such as rocks or tools. Regularly check to see if handrails are loose or broken. Make sure that both sides of any steps have handrails. Any raised decks and porches should have guardrails on the edges. Have any leaves, snow, or ice cleared regularly. Use sand or salt on walking paths during winter. Clean up any spills in your garage right away. This includes oil or grease spills. What can I do in the bathroom? Use night lights. Install grab bars by the toilet and in the tub and shower. Do not use towel bars as grab bars. Use non-skid mats or decals in the tub or shower. If you need to sit down in the shower, use a plastic, non-slip stool. Keep the floor dry. Clean up any water that spills on the floor as soon as it happens. Remove soap buildup in  the tub or shower regularly. Attach bath mats securely with double-sided non-slip rug tape. Do not have throw rugs and other things on the floor that can make you trip. What can I do in the bedroom? Use night lights. Make sure that you have a light by your bed that is easy to reach. Do not use any sheets or blankets that are too big for your bed. They should not hang down onto the floor. Have a firm chair that has side arms. You can use this for support while you get dressed. Do not have throw rugs and other things on the floor that can make you trip. What can I do in the kitchen? Clean up any spills right away. Avoid walking on wet floors. Keep items that you use a lot in easy-to-reach places. If you need to reach something above you, use a strong step stool that has a grab bar. Keep electrical cords out of the way. Do not use floor polish or wax that makes floors slippery. If you must use wax, use non-skid floor wax. Do not have throw rugs and other things on the floor that can make you trip. What can I do with my stairs? Do not leave any items on the stairs. Make sure that there are handrails on both sides of the stairs and use them. Fix handrails that are broken or loose. Make sure that handrails are as long as the stairways. Check any carpeting to make sure  that it is firmly attached to the stairs. Fix any carpet that is loose or worn. Avoid having throw rugs at the top or bottom of the stairs. If you do have throw rugs, attach them to the floor with carpet tape. Make sure that you have a light switch at the top of the stairs and the bottom of the stairs. If you do not have them, ask someone to add them for you. What else can I do to help prevent falls? Wear shoes that: Do not have high heels. Have rubber bottoms. Are comfortable and fit you well. Are closed at the toe. Do not wear sandals. If you use a stepladder: Make sure that it is fully opened. Do not climb a closed  stepladder. Make sure that both sides of the stepladder are locked into place. Ask someone to hold it for you, if possible. Clearly mark and make sure that you can see: Any grab bars or handrails. First and last steps. Where the edge of each step is. Use tools that help you move around (mobility aids) if they are needed. These include: Canes. Walkers. Scooters. Crutches. Turn on the lights when you go into a dark area. Replace any light bulbs as soon as they burn out. Set up your furniture so you have a clear path. Avoid moving your furniture around. If any of your floors are uneven, fix them. If there are any pets around you, be aware of where they are. Review your medicines with your doctor. Some medicines can make you feel dizzy. This can increase your chance of falling. Ask your doctor what other things that you can do to help prevent falls. This information is not intended to replace advice given to you by your health care provider. Make sure you discuss any questions you have with your health care provider. Document Released: 01/26/2009 Document Revised: 09/07/2015 Document Reviewed: 05/06/2014 Elsevier Interactive Patient Education  2017 [employer].

## 2022-01-24 NOTE — Progress Notes (Addendum)
Subjective:   Margaret Hill is a 66 y.o. female who presents for an Initial Medicare Annual Wellness Visit.  Review of Systems     Cardiac Risk Factors include: advanced age (>1mn, >>60women);dyslipidemia;obesity (BMI >30kg/m2)     Objective:    Today's Vitals   01/24/22 0949 01/24/22 0956  BP: 130/80   Pulse: 64   Temp: 97.8 F (36.6 C)   TempSrc: Oral   SpO2: 96%   Weight: 195 lb 12.8 oz (88.8 kg)   Height: '5\' 4"'$  (1.626 m)   PainSc:  9    Body mass index is 33.61 kg/m.     01/24/2022   10:02 AM  Advanced Directives  Does Patient Have a Medical Advance Directive? No  Would patient like information on creating a medical advance directive? No - Patient declined    Current Medications (verified) Outpatient Encounter Medications as of 01/24/2022  Medication Sig   Albuterol Sulfate 108 (90 Base) MCG/ACT AEPB Inhale 2 puffs into the lungs every 6 (six) hours as needed (for shortness of breath or cough).   fluocinonide ointment (LIDEX) 0.05 % APPLY EXTERNALLY TO THE AFFECTED AREA TWICE DAILY   losartan-hydrochlorothiazide (HYZAAR) 50-12.5 MG tablet Take 1 tablet by mouth daily.   nystatin cream (MYCOSTATIN) Apply 1 application topically 2 (two) times daily.   omeprazole (PRILOSEC) 40 MG capsule Take 1 capsule by mouth daily.   valACYclovir (VALTREX) 500 MG tablet Take 1 tablet (500 mg total) by mouth 2 (two) times daily.   Vitamin D, Ergocalciferol, (DRISDOL) 1.25 MG (50000 UNIT) CAPS capsule TAKE 1 CAPSULE BY MOUTH 2 TIMES A WEEK   buPROPion (WELLBUTRIN XL) 150 MG 24 hr tablet TAKE 1 TABLET(150 MG) BY MOUTH EVERY MORNING (Patient not taking: Reported on 01/24/2022)   ezetimibe (ZETIA) 10 MG tablet Take 1 tablet (10 mg total) by mouth daily.   hydrOXYzine (ATARAX) 10 MG tablet Take 1 tablet (10 mg total) by mouth 3 (three) times daily as needed. (Patient not taking: Reported on 01/24/2022)   ibuprofen (ADVIL) 800 MG tablet TAKE 1 TABLET(800 MG) BY MOUTH EVERY 8 HOURS  AS NEEDED (Patient not taking: Reported on 01/24/2022)   Pitavastatin Magnesium 4 MG TABS Take 1 tablet by mouth every other day. (Patient not taking: Reported on 01/24/2022)   predniSONE (STERAPRED UNI-PAK 21 TAB) 10 MG (21) TBPK tablet Take as directed (Patient not taking: Reported on 07/19/2021)   No facility-administered encounter medications on file as of 01/24/2022.    Allergies (verified) Aspirin, Ceftriaxone sodium, Codeine, Latex, Penicillins, Simvastatin, Sulfonamide derivatives, and Trimethoprim   History: Past Medical History:  Diagnosis Date   Fibromyalgia    H/O total hysterectomy    Hypercholesteremia    Hypertension    Past Surgical History:  Procedure Laterality Date   BILATERAL TEMPOROMANDIBULAR JOINT ARTHROPLASTY  1990   GALLBLADDER SURGERY  1983   PARTIAL HYSTERECTOMY  1991   Family History  Problem Relation Age of Onset   Diabetes Mother    Emphysema Father    Breast cancer Paternal Aunt    Social History   Socioeconomic History   Marital status: Divorced    Spouse name: Not on file   Number of children: Not on file   Years of education: Not on file   Highest education level: Not on file  Occupational History   Not on file  Tobacco Use   Smoking status: Never   Smokeless tobacco: Never  Vaping Use   Vaping Use: Never used  Substance and Sexual Activity   Alcohol use: Not Currently   Drug use: No   Sexual activity: Not on file  Other Topics Concern   Not on file  Social History Narrative   Not on file   Social Determinants of Health   Financial Resource Strain: Low Risk  (01/24/2022)   Overall Financial Resource Strain (CARDIA)    Difficulty of Paying Living Expenses: Not hard at all  Food Insecurity: No Food Insecurity (01/24/2022)   Hunger Vital Sign    Worried About Running Out of Food in the Last Year: Never true    Ran Out of Food in the Last Year: Never true  Transportation Needs: No Transportation Needs (01/24/2022)   PRAPARE -  Hydrologist (Medical): No    Lack of Transportation (Non-Medical): No  Physical Activity: Insufficiently Active (01/24/2022)   Exercise Vital Sign    Days of Exercise per Week: 3 days    Minutes of Exercise per Session: 20 min  Stress: No Stress Concern Present (01/24/2022)   [place]    Feeling of Stress : Not at all  Social Connections: Not on file    Tobacco Counseling Counseling given: Not Answered   Clinical Intake:  Pre-visit preparation completed: Yes  Pain : 0-10 Pain Score: 9  Pain Type: Chronic pain Pain Location: Generalized Pain Descriptors / Indicators: Aching Pain Onset: More than a month ago Pain Frequency: Constant     Nutritional Status: BMI > 30  Obese Nutritional Risks: None Diabetes: No  How often do you need to have someone help you when you read instructions, pamphlets, or other written materials from your doctor or pharmacy?: 1 - Never What is the last grade level you completed in school?: Bachelors degree  Diabetic? no  Interpreter Needed?: No  Information entered by :: NAllen LPN   Activities of Daily Living    01/24/2022   10:03 AM 01/23/2022   12:03 PM  In your present state of health, do you have any difficulty performing the following activities:  Hearing? 0 0  Vision? 0 0  Difficulty concentrating or making decisions? 0 0  Walking or climbing stairs? 0 0  Dressing or bathing? 0 0  Doing errands, shopping? 0 0  Preparing Food and eating ? N N  Using the Toilet? N N  In the past six months, have you accidently leaked urine? N N  Do you have problems with loss of bowel control? N N  Managing your Medications? N N  Managing your Finances? N N  Housekeeping or managing your Housekeeping? N N    Patient Care Team: [name], FNP as PCP - General (General Practice)  Indicate any recent Medical Services you may have received  from other than [facility] providers in the past year (date may be approximate).     Assessment:   This is a routine wellness examination for [facility]  Hearing/Vision screen Vision Screening - Comments:: Regular eye exams, [facility]  Dietary issues and exercise activities discussed: Current Exercise Habits: Home exercise routine, Type of exercise: walking, Time (Minutes): 20, Frequency (Times/Week): 3, Weekly Exercise (Minutes/Week): 60   Goals Addressed             This Visit's Progress    Patient Stated       01/24/2022, eat healthier       Depression Screen    01/24/2022   10:03 AM 04/03/2021  8:46 AM 10/10/2020   10:11 AM 07/21/2019    2:23 PM 06/16/2019   10:42 AM 05/04/2019    2:19 PM 03/25/2019    9:29 AM  PHQ 2/9 Scores  PHQ - 2 Score 0 0 4 0 0 0 0  PHQ- 9 Score  0 17        Fall Risk    01/24/2022   10:03 AM 01/23/2022   12:03 PM 04/03/2021    8:47 AM 07/21/2019    2:23 PM 06/16/2019   10:42 AM  Fall Risk   Falls in the past year? 0 0 0 0 0  Number falls in past yr: 0 0 0    Injury with Fall? 0 0 0    Risk for fall due to : Medication side effect      Follow up Falls prevention discussed;Education provided;Falls evaluation completed        FALL RISK PREVENTION PERTAINING TO THE HOME:  Any stairs in or around the home? Yes  If so, are there any without handrails? No  Home free of loose throw rugs in walkways, pet beds, electrical cords, etc? Yes  Adequate lighting in your home to reduce risk of falls? Yes   ASSISTIVE DEVICES UTILIZED TO PREVENT FALLS:  Life alert? No  Use of a cane, walker or w/c? No  Grab bars in the bathroom? No  Shower chair or bench in shower? No  Elevated toilet seat or a handicapped toilet? No   TIMED UP AND GO:  Was the test performed? Yes .  Length of time to ambulate 10 feet: 5 sec.   Gait steady and fast without use of assistive device  Cognitive Function:        01/24/2022   10:04 AM  6CIT Screen  What Year? 0  points  What month? 0 points  What time? 0 points  Count back from 20 0 points  Months in reverse 0 points  Repeat phrase 0 points  Total Score 0 points    Immunizations Immunization History  Administered Date(s) Administered   Fluad Quad(high Dose 65+) 04/03/2021   Influenza-Unspecified 01/27/2018, 01/18/2019, 01/31/2020   PFIZER(Purple Top)SARS-COV-2 Vaccination 07/06/2019, 07/27/2019, 03/30/2020   Pneumococcal Conjugate-13 01/16/2015   Pneumococcal Polysaccharide-23 11/08/2020   Td 04/15/2004    TDAP status: Due, Education has been provided regarding the importance of this vaccine. Advised may receive this vaccine at local pharmacy or Health Dept. Aware to provide a copy of the vaccination record if obtained from local pharmacy or Health Dept. Verbalized acceptance and understanding.  Flu Vaccine status: Up to date  Pneumococcal vaccine status: Up to date  Covid-19 vaccine status: Completed vaccines  Qualifies for Shingles Vaccine? Yes   Zostavax completed No   Shingrix Completed?: No.    Education has been provided regarding the importance of this vaccine. Patient has been advised to call insurance company to determine out of pocket expense if they have not yet received this vaccine. Advised may also receive vaccine at local pharmacy or Health Dept. Verbalized acceptance and understanding.  Screening Tests Health Maintenance  Topic Date Due   COVID-19 Vaccine (4 - Pfizer risk series) 05/25/2020   TETANUS/TDAP  01/27/2021   INFLUENZA VACCINE  11/13/2021   Zoster Vaccines- Shingrix (1 of 2) 04/26/2022 (Originally 02/25/1975)   MAMMOGRAM  03/20/2023   Pneumonia Vaccine 85+ Years old (3 - PPSV23 or PCV20) 11/08/2025   COLONOSCOPY (Pts 45-68yr Insurance coverage will need to be confirmed)  07/13/2029   DEXA  SCAN  Completed   Hepatitis C Screening  Completed   HIV Screening  Completed   HPV VACCINES  Aged Out   PAP SMEAR-Modifier  Discontinued    Health  Maintenance  Health Maintenance Due  Topic Date Due   COVID-19 Vaccine (4 - Pfizer risk series) 05/25/2020   TETANUS/TDAP  01/27/2021   INFLUENZA VACCINE  11/13/2021    Colorectal cancer screening: Type of screening: Colonoscopy. Completed 07/14/2019. Repeat every 7 years  Mammogram status: Completed 03/19/2021. Repeat every year  Bone Density status: Completed 09/17/2021.   Lung Cancer Screening: (Low Dose CT Chest recommended if Age 4-80 years, 30 pack-year currently smoking OR have quit w/in 15years.) does not qualify.   Lung Cancer Screening Referral: no  Additional Screening:  Hepatitis C Screening: does qualify; Completed 03/23/2018  Vision Screening: Recommended annual ophthalmology exams for early detection of glaucoma and other disorders of the eye. Is the patient up to date with their annual eye exam?  Yes  Who is the provider or what is the name of the office in which the patient attends annual eye exams? [facility] If pt is not established with a provider, would they like to be referred to a provider to establish care? No .   Dental Screening: Recommended annual dental exams for proper oral hygiene  Community Resource Referral / Chronic Care Management: CRR required this visit?  No   CCM required this visit?  No      Plan:     I have personally reviewed and noted the following in the patient's chart:   Medical and social history Use of alcohol, tobacco or illicit drugs  Current medications and supplements including opioid prescriptions. Patient is not currently taking opioid prescriptions. Functional ability and status Nutritional status Physical activity Advanced directives List of other physicians Hospitalizations, surgeries, and ER visits in previous 12 months Vitals Screenings to include cognitive, depression, and falls Referrals and appointments  In addition, I have reviewed and discussed with patient certain preventive protocols, quality metrics,  and best practice recommendations. A written personalized care plan for preventive services as well as general preventive health recommendations were provided to patient.     Kellie Simmering, LPN   [id number]   Nurse Notes: After completing visit it was noted that patient needed a welcome to Medicare visit before 02/13/2022.

## 2022-01-24 NOTE — Addendum Note (Signed)
Addended by: Glenna Durand E on: 01/24/2022 02:21 PM   Modules accepted: Level of Service

## 2022-02-11 ENCOUNTER — Other Ambulatory Visit: Payer: Self-pay | Admitting: Internal Medicine

## 2022-02-11 DIAGNOSIS — Z1231 Encounter for screening mammogram for malignant neoplasm of breast: Secondary | ICD-10-CM

## 2022-03-02 ENCOUNTER — Other Ambulatory Visit: Payer: Self-pay | Admitting: Nurse Practitioner

## 2022-03-02 DIAGNOSIS — I10 Essential (primary) hypertension: Secondary | ICD-10-CM

## 2022-03-19 ENCOUNTER — Ambulatory Visit: Payer: Medicare Other | Admitting: Nurse Practitioner

## 2022-03-20 ENCOUNTER — Encounter (HOSPITAL_BASED_OUTPATIENT_CLINIC_OR_DEPARTMENT_OTHER): Payer: Self-pay | Admitting: Internal Medicine

## 2022-03-20 ENCOUNTER — Ambulatory Visit (INDEPENDENT_AMBULATORY_CARE_PROVIDER_SITE_OTHER): Payer: Medicare Other | Admitting: Internal Medicine

## 2022-03-20 VITALS — BP 126/80 | HR 82 | Ht 64.0 in | Wt 195.0 lb

## 2022-03-20 DIAGNOSIS — M791 Myalgia, unspecified site: Secondary | ICD-10-CM

## 2022-03-20 DIAGNOSIS — Z8249 Family history of ischemic heart disease and other diseases of the circulatory system: Secondary | ICD-10-CM

## 2022-03-20 DIAGNOSIS — E785 Hyperlipidemia, unspecified: Secondary | ICD-10-CM

## 2022-03-20 DIAGNOSIS — T466X5A Adverse effect of antihyperlipidemic and antiarteriosclerotic drugs, initial encounter: Secondary | ICD-10-CM

## 2022-03-20 NOTE — Patient Instructions (Signed)
Medication Instructions:  NO CHANGES TODAY -- will depend on lab results  *If you need a refill on your cardiac medications before your next appointment, please call your pharmacy*   Lab Work: Lipid Panel TODAY    FASTING lab work before next appointment   If you have labs (blood work) drawn today and your tests are completely normal, you will receive your results only by: [place] (if you have MyChart) OR A paper copy in the mail If you have any lab test that is abnormal or we need to change your treatment, we will call you to review the results.  Follow-Up: At [facility], you and your health needs are our priority.  As part of our continuing mission to provide you with exceptional heart care, we have created designated Provider Care Teams.  These Care Teams include your primary Cardiologist (physician) and Advanced Practice Providers (APPs -  Physician Assistants and Nurse Practitioners) who all work together to provide you with the care you need, when you need it.  We recommend signing up for the patient portal called "MyChart".  Sign up information is provided on this After Visit Summary.  MyChart is used to connect with patients for Virtual Visits (Telemedicine).  Patients are able to view lab/test results, encounter notes, upcoming appointments, etc.  Non-urgent messages can be sent to your provider as well.   To learn more about what you can do with MyChart, go to [email or web address].    Your next appointment:   6 month(s)  The format for your next appointment:   In Person  Provider:   Lyman Bishop MD

## 2022-03-20 NOTE — Progress Notes (Addendum)
LIPID CLINIC CONSULT NOTE  Chief Complaint:  Follow-up dyslipidemia  Primary Care Physician: [name], FNP  Primary Cardiologist:  None  HPI:  Margaret Hill is a 66 y.o. female who is being seen today for the evaluation of dyslipidemia at the request of [name], [place].  This is a pleasant 66 year old female with a history of fibromyalgia intolerance, dyslipidemia, hypertension and family history of heart disease including her father who had a stent in his 88s, mother with diabetes, and brother who had heart disease and a pacemaker in his 89s.  She has longstanding elevated cholesterol but had difficulty taking statins because it worsened her fibromyalgia.  More recently she was able to take Livalo 4 mg every other day.  Unfortunately her insurance did not cover it the cost is becoming prohibitive.  She has received samples from her PCP.  Recent labs on therapy in December 2021 showed total cholesterol 178, HDL 71, triglycerides 52 and LDL 97.  This is reasonable treatment however of course given her family history her targets may need to be lower.  We discussed the possibility of further coronary evaluation with calcium scoring.  07/19/2021  Margaret Hill returns today for follow-up of her dyslipidemia.  She had messaged [place] that after switching to [facility], she noted that she started having some worsening fibromyalgia.  Previously she had taken the Livalo 4 mg every other day but had been taking the Zypitamag every day.  She was able to get it through [facility] in [place] and a much lower cost.  I am not sure that this actually caused her worsening fibromyalgia and she is not convinced either.  Her cholesterol however was lower and then when she came off of medication now has recently resulted with LDL back up to 158 from 97.  She agrees she will need additional therapy.  She is interested in restarting the medication but every other day.  03/20/2022  Margaret Hill is seen today in  follow-up.  She reports she has not taking any of statins.  She is actually taking Zetia but every other day which she says is better tolerated than every day which worsens her fibromyalgia pain.  Her cholesterol had gone up quite a bit more, last LDL was 158.  She has not had labs prior to this visit.  PMHx:  Past Medical History:  Diagnosis Date   Fibromyalgia    H/O total hysterectomy    Hypercholesteremia    Hypertension     Past Surgical History:  Procedure Laterality Date   BILATERAL TEMPOROMANDIBULAR JOINT ARTHROPLASTY  1990   GALLBLADDER SURGERY  1983   PARTIAL HYSTERECTOMY  1991    FAMHx:  Family History  Problem Relation Age of Onset   Diabetes Mother    Emphysema Father    Breast cancer Paternal Aunt     SOCHx:   reports that she has never smoked. She has never used smokeless tobacco. She reports that she does not currently use alcohol. She reports that she does not use drugs.  ALLERGIES:  Allergies  Allergen Reactions   Aspirin     REACTION: stomach pain   Ceftriaxone Sodium    Codeine     REACTION: shortness of breath   Latex    Penicillins     REACTION: hives/skin rash   Simvastatin     REACTION: muscular pain   Sulfonamide Derivatives     REACTION: hives/skin rash   Trimethoprim     REACTION: Hives/skin rash  ROS: Pertinent items noted in HPI and remainder of comprehensive ROS otherwise negative.  HOME MEDS: Current Outpatient Medications on File Prior to Visit  Medication Sig Dispense Refill   Albuterol Sulfate 108 (90 Base) MCG/ACT AEPB Inhale 2 puffs into the lungs every 6 (six) hours as needed (for shortness of breath or cough). 1 each 1   buPROPion (WELLBUTRIN XL) 150 MG 24 hr tablet TAKE 1 TABLET(150 MG) BY MOUTH EVERY MORNING 90 tablet 1   fluocinonide ointment (LIDEX) 0.05 % APPLY EXTERNALLY TO THE AFFECTED AREA TWICE DAILY 30 g 1   losartan-hydrochlorothiazide (HYZAAR) 50-12.5 MG tablet TAKE 1 TABLET BY MOUTH DAILY 30 tablet 0    nitrofurantoin, macrocrystal-monohydrate, (MACROBID) 100 MG capsule Take 1 capsule by mouth at bedtime as needed.     nystatin cream (MYCOSTATIN) Apply 1 application topically 2 (two) times daily. 30 g 0   omeprazole (PRILOSEC OTC) 20 MG tablet Take 20 mg by mouth daily.     valACYclovir (VALTREX) 500 MG tablet Take 500 mg by mouth 2 (two) times daily as needed.     Vitamin D, Ergocalciferol, (DRISDOL) 1.25 MG (50000 UNIT) CAPS capsule TAKE 1 CAPSULE BY MOUTH 2 TIMES A WEEK 24 capsule 1   ezetimibe (ZETIA) 10 MG tablet Take 1 tablet (10 mg total) by mouth daily. 30 tablet 6   No current facility-administered medications on file prior to visit.    LABS/IMAGING: No results found for this or any previous visit (from the past 48 hour(s)). No results found.  LIPID PANEL:    Component Value Date/Time   CHOL 257 (H) 07/11/2021 0843   TRIG 71 07/11/2021 0843   HDL 87 07/11/2021 0843   CHOLHDL 3.0 07/11/2021 0843   CHOLHDL 2.2 Ratio 01/22/2008 2254   VLDL 12 01/22/2008 2254   LDLCALC 158 (H) 07/11/2021 0843    WEIGHTS: Wt Readings from Last 3 Encounters:  03/20/22 195 lb (88.5 kg)  01/24/22 195 lb 12.8 oz (88.8 kg)  07/19/21 197 lb (89.4 kg)    VITALS: BP 126/80   Pulse 82   Ht '5\' 4"'$  (1.626 m)   Wt 195 lb (88.5 kg)   BMI 33.47 kg/m   EXAM: Deferred  EKG: Deferred  ASSESSMENT: Mixed dyslipidemia Statin intolerant Hypertension Family history of premature coronary disease Fibromyalgia 0 CAC score (03/2021)  PLAN: 1.   Margaret Hill is only taking Zetia every other day.  She needs a repeat lipid.  She is intolerant to at least 2 statins and cannot take regular Zetia because of worsening fibromyalgia pain.  She will likely need additional therapies.  Fortunately, she had no coronary calcium in December 2022 but does have a family history of early onset heart disease.  Will repeat fasting lipids today and treat accordingly.  Plan follow-up with me in 6 months or sooner as  necessary.  Pixie Casino, MD, [facility], [place] Director of the Advanced Lipid Disorders &  Cardiovascular Risk Reduction Clinic Diplomate of the American Board of Clinical Lipidology Attending Cardiologist  Direct Dial: [phone]  Fax: [phone]  Website:  [email or web address] 03/20/2022, 9:43 AM

## 2022-03-21 ENCOUNTER — Encounter (HOSPITAL_BASED_OUTPATIENT_CLINIC_OR_DEPARTMENT_OTHER): Payer: Self-pay | Admitting: Internal Medicine

## 2022-03-21 LAB — LIPID PANEL
Chol/HDL Ratio: 2.8 ratio (ref 0.0–4.4)
Cholesterol, Total: 204 mg/dL — ABNORMAL HIGH (ref 100–199)
HDL: 72 mg/dL (ref >39)
LDL Chol Calc (NIH): 119 mg/dL — ABNORMAL HIGH (ref 0–99)
Triglycerides: 74 mg/dL (ref 0–149)
VLDL Cholesterol Cal: 13 mg/dL (ref 5–40)

## 2022-04-03 ENCOUNTER — Ambulatory Visit
Admission: RE | Admit: 2022-04-03 | Discharge: 2022-04-03 | Disposition: A | Discharge status: Home / Self Care | Payer: Medicare Other | Source: Ambulatory Visit | Attending: Internal Medicine | Admitting: Internal Medicine

## 2022-04-03 DIAGNOSIS — Z1231 Encounter for screening mammogram for malignant neoplasm of breast: Secondary | ICD-10-CM

## 2022-04-15 ENCOUNTER — Encounter: Payer: Self-pay | Admitting: Nurse Practitioner

## 2022-05-21 ENCOUNTER — Encounter (HOSPITAL_BASED_OUTPATIENT_CLINIC_OR_DEPARTMENT_OTHER): Payer: Self-pay | Admitting: Internal Medicine

## 2022-07-26 ENCOUNTER — Other Ambulatory Visit: Payer: Self-pay | Admitting: Internal Medicine

## 2022-10-04 ENCOUNTER — Ambulatory Visit
Admission: EM | Admit: 2022-10-04 | Discharge: 2022-10-04 | Disposition: A | Discharge status: Home / Self Care | Payer: Medicare Other | Attending: Family Medicine | Admitting: Family Medicine

## 2022-10-04 ENCOUNTER — Ambulatory Visit (INDEPENDENT_AMBULATORY_CARE_PROVIDER_SITE_OTHER): Payer: Medicare Other

## 2022-10-04 DIAGNOSIS — S4992XA Unspecified injury of left shoulder and upper arm, initial encounter: Secondary | ICD-10-CM | POA: Diagnosis not present

## 2022-10-04 MED ORDER — PREDNISONE 20 MG PO TABS: 20.0000 mg | ORAL_TABLET | Freq: Every day | ORAL | 0 refills | Status: AC

## 2022-10-04 MED ORDER — CYCLOBENZAPRINE HCL 10 MG PO TABS: 10.0000 mg | ORAL_TABLET | Freq: Two times a day (BID) | ORAL | 0 refills | Status: DC | PRN

## 2022-10-04 NOTE — ED Triage Notes (Signed)
Pt reports she missed her last two steps in her home this morning. Her left shoulder hit the ceramic tile floor. Pt states the pain is radiating up to her neck.   Pt has limited ROM in left shoulder past rest.

## 2022-10-04 NOTE — ED Provider Notes (Signed)
[facility]    CSN: [id number] Arrival date & time: 10/04/22  1746      History   Chief Complaint Chief Complaint  Patient presents with   Fall    HPI Margaret Hill is a 67 y.o. female.   HPI Patient ended for evaluation of a left shoulder injury after falling after missing the steps at her home and fell and landed on her left shoulder onto the ceramic tile on her home.  States that the pain has gradually worsened over the course of the day and she is currently unable to fully take or extend her left arm and or rotate her left shoulder. Injury occurred approximately 6-7 hours ago. She has taken antiinflammatories without relief of pain symptoms.  Past Medical History:  Diagnosis Date   Fibromyalgia    H/O total hysterectomy    Hypercholesteremia    Hypertension     Patient Active Problem List   Diagnosis Date Noted   Arthralgia 10/05/2019   Pain of right thumb 10/05/2019   Dizziness 07/21/2019   Seasonal allergies 07/21/2019   Achilles tendon pain 09/21/2018   Acute nonintractable headache 09/21/2018   Nausea 09/21/2018   Vitamin D deficiency 03/23/2018   Hyperlipidemia 03/23/2018   Prediabetes 03/23/2018   Encounter for screening colonoscopy 03/23/2018   Fibromyalgia 03/23/2018   HYPERTENSION, BENIGN ESSENTIAL 10/27/2007   HYPERCHOLESTEROLEMIA 10/07/2007   OVERWEIGHT 10/07/2007   ECZEMA 10/07/2007   FIBROMYALGIA 04/15/1989    Past Surgical History:  Procedure Laterality Date   BILATERAL TEMPOROMANDIBULAR JOINT ARTHROPLASTY  1990   GALLBLADDER SURGERY  1983   PARTIAL HYSTERECTOMY  1991    OB History   No obstetric history on file.      Home Medications    Prior to Admission medications   Medication Sig Start Date End Date Taking? Authorizing Provider  cyclobenzaprine (FLEXERIL) 10 MG tablet Take 1 tablet (10 mg total) by mouth 2 (two) times daily as needed for muscle spasms. 10/04/22  Yes Bing Neighbors, NP  predniSONE (DELTASONE)  20 MG tablet Take 1 tablet (20 mg total) by mouth daily with breakfast for 5 days. 10/04/22 10/09/22 Yes Bing Neighbors, NP  Albuterol Sulfate 108 (90 Base) MCG/ACT AEPB Inhale 2 puffs into the lungs every 6 (six) hours as needed (for shortness of breath or cough). 06/17/19   Arnette Felts, FNP  buPROPion (WELLBUTRIN XL) 150 MG 24 hr tablet TAKE 1 TABLET(150 MG) BY MOUTH EVERY MORNING 11/12/21   Arnette Felts, FNP  ezetimibe (ZETIA) 10 MG tablet TAKE 1 TABLET(10 MG) BY MOUTH DAILY 07/26/22   Hilty, Lisette Abu, MD  fluocinonide ointment (LIDEX) 0.05 % APPLY EXTERNALLY TO THE AFFECTED AREA TWICE DAILY 09/27/21   Arnette Felts, FNP  losartan-hydrochlorothiazide (HYZAAR) 50-12.5 MG tablet TAKE 1 TABLET BY MOUTH DAILY 03/04/22   Arnette Felts, FNP  nitrofurantoin, macrocrystal-monohydrate, (MACROBID) 100 MG capsule Take 1 capsule by mouth at bedtime as needed.    [provider]  nystatin cream (MYCOSTATIN) Apply 1 application topically 2 (two) times daily. 08/16/19   Arnette Felts, FNP  omeprazole (PRILOSEC OTC) 20 MG tablet Take 20 mg by mouth daily.    [provider]  valACYclovir (VALTREX) 500 MG tablet Take 500 mg by mouth 2 (two) times daily as needed.    [provider]  Vitamin D, Ergocalciferol, (DRISDOL) 1.25 MG (50000 UNIT) CAPS capsule TAKE 1 CAPSULE BY MOUTH 2 TIMES A WEEK 08/04/20   Arnette Felts, FNP    Family  History Family History  Problem Relation Age of Onset   Diabetes Mother    Emphysema Father    Breast cancer Paternal Aunt     Social History Social History   Tobacco Use   Smoking status: Never   Smokeless tobacco: Never  Vaping Use   Vaping Use: Never used  Substance Use Topics   Alcohol use: Not Currently   Drug use: No     Allergies   Aspirin, Codeine, Latex, Penicillins, Pitavastatin, Simvastatin, Sulfonamide derivatives, and Trimethoprim   Review of Systems Review of Systems Pertinent negatives listed in HPI   Physical Exam Triage  Vital Signs ED Triage Vitals [10/04/22 1844]  Enc Vitals Group     BP (!) 142/87     Pulse Rate 79     Resp 18     Temp 98.2 F (36.8 C)     Temp Source Oral     SpO2 94 %     Weight      Height      Head Circumference      Peak Flow      Pain Score 8     Pain Loc      Pain Edu?      Excl. in GC?    No data found.  Updated Vital Signs BP (!) 142/87 (BP Location: Right Arm)   Pulse 79   Temp 98.2 F (36.8 C) (Oral)   Resp 18   SpO2 94%   Visual Acuity Right Eye Distance:   Left Eye Distance:   Bilateral Distance:    Right Eye Near:   Left Eye Near:    Bilateral Near:     Physical Exam Vitals reviewed.  Constitutional:      Appearance: Normal appearance.  HENT:     Head: Normocephalic.  Cardiovascular:     Rate and Rhythm: Normal rate and regular rhythm.  Pulmonary:     Effort: Pulmonary effort is normal.     Breath sounds: Normal breath sounds.  Musculoskeletal:     Right shoulder: Normal.     Left shoulder: Tenderness and bony tenderness present. No deformity. Decreased range of motion. Decreased strength.     Cervical back: Normal range of motion and neck supple. No tenderness.  Skin:    General: Skin is warm and dry.  Neurological:     General: No focal deficit present.     Mental Status: She is alert.      UC Treatments / Results  Labs (all labs ordered are listed, but only abnormal results are displayed) Labs Reviewed - No data to display  EKG   Radiology No results found.  Procedures Procedures (including critical care time)  Medications Ordered in UC Medications - No data to display  Initial Impression / Assessment and Plan / UC Course  I have reviewed the triage vital signs and the nursing notes.  Pertinent labs & imaging results that were available during my care of the patient were reviewed by me and considered in my medical decision making (see chart for details).    Injury of left shoulder, imaging negative for an acute  fracture or dislocation. Given limited ROM and decreased strength, unable to rule out rotator cuff injury. Will trial prednisone 20 mg daily x 5 days and cyclobenzaprine for pain. Shoulder immobilizer applied to left shoulder.  Patient given follow-up precautions as follows if pain has not improved or ability to perform ROM is have not improved within 48 hours recommend follow-up in  walk-in clinic at [facility] urgent care.  Patient verbalized understanding and agreement with plan. Final Clinical Impressions(s) / UC Diagnoses   Final diagnoses:  Injury of left shoulder, initial encounter     Discharge Instructions      Imaging of your left shoulder is negative for fracture or dislocation. For acute pain recommend Tylenol as needed for pain.  Recommend applying ice to shoulder. If your range of motion has not improved within the next 2 days I would recommend follow-up at [facility] walk-in urgent care clinic.  You have sustained any type of a rotator cuff injury this would not be able to be seen on an x-ray.      ED Prescriptions     Medication Sig Dispense Auth. Provider   cyclobenzaprine (FLEXERIL) 10 MG tablet Take 1 tablet (10 mg total) by mouth 2 (two) times daily as needed for muscle spasms. 20 tablet Bing Neighbors, NP   predniSONE (DELTASONE) 20 MG tablet Take 1 tablet (20 mg total) by mouth daily with breakfast for 5 days. 5 tablet Bing Neighbors, NP      PDMP not reviewed this encounter.   Bing Neighbors, NP 10/07/22 2150

## 2022-10-04 NOTE — Discharge Instructions (Addendum)
Imaging of your left shoulder is negative for fracture or dislocation. For acute pain recommend Tylenol as needed for pain.  Recommend applying ice to shoulder. If your range of motion has not improved within the next 2 days I would recommend follow-up at [facility] walk-in urgent care clinic.  You have sustained any type of a rotator cuff injury this would not be able to be seen on an x-ray.

## 2022-10-14 ENCOUNTER — Other Ambulatory Visit: Payer: Self-pay | Admitting: Internal Medicine

## 2022-10-14 DIAGNOSIS — E785 Hyperlipidemia, unspecified: Secondary | ICD-10-CM

## 2022-10-24 LAB — LIPID PANEL
Chol/HDL Ratio: 2.9 ratio (ref 0.0–4.4)
Cholesterol, Total: 216 mg/dL — ABNORMAL HIGH (ref 100–199)
HDL: 75 mg/dL (ref >39)
LDL Chol Calc (NIH): 130 mg/dL — ABNORMAL HIGH (ref 0–99)
Triglycerides: 61 mg/dL (ref 0–149)
VLDL Cholesterol Cal: 11 mg/dL (ref 5–40)

## 2022-10-29 ENCOUNTER — Ambulatory Visit (INDEPENDENT_AMBULATORY_CARE_PROVIDER_SITE_OTHER): Payer: Medicare Other | Admitting: Internal Medicine

## 2022-10-29 ENCOUNTER — Encounter (HOSPITAL_BASED_OUTPATIENT_CLINIC_OR_DEPARTMENT_OTHER): Payer: Self-pay | Admitting: Internal Medicine

## 2022-10-29 VITALS — BP 112/70 | HR 85 | Ht 64.0 in | Wt 197.8 lb

## 2022-10-29 DIAGNOSIS — M791 Myalgia, unspecified site: Secondary | ICD-10-CM

## 2022-10-29 DIAGNOSIS — T466X5D Adverse effect of antihyperlipidemic and antiarteriosclerotic drugs, subsequent encounter: Secondary | ICD-10-CM

## 2022-10-29 DIAGNOSIS — T466X5A Adverse effect of antihyperlipidemic and antiarteriosclerotic drugs, initial encounter: Secondary | ICD-10-CM

## 2022-10-29 DIAGNOSIS — E785 Hyperlipidemia, unspecified: Secondary | ICD-10-CM | POA: Diagnosis not present

## 2022-10-29 NOTE — Patient Instructions (Signed)
Medication Instructions:  NO CHANGES  *If you need a refill on your cardiac medications before your next appointment, please call your pharmacy*   Lab Work: FASTING lipid panel in 6 months -- complete about a week before next visit  If you have labs (blood work) drawn today and your tests are completely normal, you will receive your results only by: MyChart Message (if you have MyChart) OR A paper copy in the mail If you have any lab test that is abnormal or we need to change your treatment, we will call you to review the results.   Testing/Procedures: NONE   Follow-Up: At [facility], you and your health needs are our priority.  As part of our continuing mission to provide you with exceptional heart care, we have created designated Provider Care Teams.  These Care Teams include your primary Cardiologist (physician) and Advanced Practice Providers (APPs -  Physician Assistants and Nurse Practitioners) who all work together to provide you with the care you need, when you need it.  We recommend signing up for the patient portal called "MyChart".  Sign up information is provided on this After Visit Summary.  MyChart is used to connect with patients for Virtual Visits (Telemedicine).  Patients are able to view lab/test results, encounter notes, upcoming appointments, etc.  Non-urgent messages can be sent to your provider as well.   To learn more about what you can do with MyChart, go to [email or web address].    Your next appointment:    6 months with Dr. Rennis Golden

## 2022-10-29 NOTE — Progress Notes (Signed)
LIPID CLINIC CONSULT NOTE  Chief Complaint:  Follow-up dyslipidemia  Primary Care Physician: Margaret Mis, MD  Primary Cardiologist:  None  HPI:  Margaret Hill is a 67 y.o. female who is being seen today for the evaluation of dyslipidemia at the request of Margaret Mis, MD.  This is a pleasant 67 year old female with a history of fibromyalgia intolerance, dyslipidemia, hypertension and family history of heart disease including her father who had a stent in his 49s, mother with diabetes, and brother who had heart disease and a pacemaker in his 43s.  She has longstanding elevated cholesterol but had difficulty taking statins because it worsened her fibromyalgia.  More recently she was able to take Livalo 4 mg every other day.  Unfortunately her insurance did not cover it the cost is becoming prohibitive.  She has received samples from her PCP.  Recent labs on therapy in December 2021 showed total cholesterol 178, HDL 71, triglycerides 52 and LDL 97.  This is reasonable treatment however of course given her family history her targets may need to be lower.  We discussed the possibility of further coronary evaluation with calcium scoring.  07/19/2021  Margaret Hill returns today for follow-up of her dyslipidemia.  She had messaged [place] that after switching to [facility], she noted that she started having some worsening fibromyalgia.  Previously she had taken the Livalo 4 mg every other day but had been taking the Zypitamag every day.  She was able to get it through [facility] in [place] and a much lower cost.  I am not sure that this actually caused her worsening fibromyalgia and she is not convinced either.  Her cholesterol however was lower and then when she came off of medication now has recently resulted with LDL back up to 158 from 97.  She agrees she will need additional therapy.  She is interested in restarting the medication but every other day.  03/20/2022  Margaret Hill is seen today in  follow-up.  She reports she has not taking any of statins.  She is actually taking Zetia but every other day which she says is better tolerated than every day which worsens her fibromyalgia pain.  Her cholesterol had gone up quite a bit more, last LDL was 158.  She has not had labs prior to this visit.  10/29/2022  Margaret Hill returns today for follow-up.  She reports has been taking the Zetia every day, however despite this her cholesterol has gone up somewhat.  LDL now 130 with total cholesterol 216.  She has been a little less active and has been trying to modify her diet.  She recently had a fall and injured her left shoulder.  PMHx:  Past Medical History:  Diagnosis Date   Fibromyalgia    H/O total hysterectomy    Hypercholesteremia    Hypertension     Past Surgical History:  Procedure Laterality Date   BILATERAL TEMPOROMANDIBULAR JOINT ARTHROPLASTY  1990   GALLBLADDER SURGERY  1983   PARTIAL HYSTERECTOMY  1991    FAMHx:  Family History  Problem Relation Age of Onset   Diabetes Mother    Emphysema Father    Breast cancer Paternal Aunt     SOCHx:   reports that she has never smoked. She has never used smokeless tobacco. She reports that she does not currently use alcohol. She reports that she does not use drugs.  ALLERGIES:  Allergies  Allergen Reactions   Aspirin  REACTION: stomach pain   Codeine     REACTION: shortness of breath   Latex    Penicillins     REACTION: hives/skin rash   Pitavastatin Other (See Comments)    Worsening pain   Simvastatin     REACTION: muscular pain   Sulfonamide Derivatives     REACTION: hives/skin rash   Trimethoprim     REACTION: Hives/skin rash    ROS: Pertinent items noted in HPI and remainder of comprehensive ROS otherwise negative.  HOME MEDS: Current Outpatient Medications on File Prior to Visit  Medication Sig Dispense Refill   Albuterol Sulfate 108 (90 Base) MCG/ACT AEPB Inhale 2 puffs into the lungs every 6 (six)  hours as needed (for shortness of breath or cough). 1 each 1   buPROPion (WELLBUTRIN XL) 150 MG 24 hr tablet TAKE 1 TABLET(150 MG) BY MOUTH EVERY MORNING 90 tablet 1   ezetimibe (ZETIA) 10 MG tablet TAKE 1 TABLET(10 MG) BY MOUTH DAILY 30 tablet 6   fluocinonide ointment (LIDEX) 0.05 % APPLY EXTERNALLY TO THE AFFECTED AREA TWICE DAILY 30 g 1   losartan-hydrochlorothiazide (HYZAAR) 50-12.5 MG tablet TAKE 1 TABLET BY MOUTH DAILY 30 tablet 0   nitrofurantoin, macrocrystal-monohydrate, (MACROBID) 100 MG capsule Take 1 capsule by mouth at bedtime as needed.     nystatin cream (MYCOSTATIN) Apply 1 application topically 2 (two) times daily. 30 g 0   omeprazole (PRILOSEC OTC) 20 MG tablet Take 20 mg by mouth daily.     valACYclovir (VALTREX) 500 MG tablet Take 500 mg by mouth 2 (two) times daily as needed.     Vitamin D, Ergocalciferol, (DRISDOL) 1.25 MG (50000 UNIT) CAPS capsule TAKE 1 CAPSULE BY MOUTH 2 TIMES A WEEK 24 capsule 1   No current facility-administered medications on file prior to visit.    LABS/IMAGING: No results found for this or any previous visit (from the past 48 hour(s)). No results found.  LIPID PANEL:    Component Value Date/Time   CHOL 216 (H) 10/23/2022 0915   TRIG 61 10/23/2022 0915   HDL 75 10/23/2022 0915   CHOLHDL 2.9 10/23/2022 0915   CHOLHDL 2.2 Ratio 01/22/2008 2254   VLDL 12 01/22/2008 2254   LDLCALC 130 (H) 10/23/2022 0915    WEIGHTS: Wt Readings from Last 3 Encounters:  10/29/22 197 lb 12.8 oz (89.7 kg)  03/20/22 195 lb (88.5 kg)  01/24/22 195 lb 12.8 oz (88.8 kg)    VITALS: BP 112/70   Pulse 85   Ht 5\' 4"  (1.626 m)   Wt 197 lb 12.8 oz (89.7 kg)   SpO2 99%   BMI 33.95 kg/m   EXAM: Deferred  EKG: Deferred  ASSESSMENT: Mixed dyslipidemia, goal LDL less than 100 Statin intolerant Hypertension Family history of premature coronary disease Fibromyalgia 0 CAC score (03/2021)  PLAN: 1.   Ms. Margaret Hill has had an increase in her cholesterol  recently.  Her target LDL is less than 100 as she had no coronary calcium in 2022.  She is taking Zetia daily although her cholesterol is gone up and her weight is gone up some.  She is try to work more with diet and exercise and will repeat lipids in 6 months with no changes to her meds at this time.  Follow-up afterwards.  Chrystie Nose, MD, [facility], FACP  [place]  [facility]  Medical Director of the Advanced Lipid Disorders &  Cardiovascular Risk Reduction Clinic Diplomate of the American Board of Clinical Lipidology Attending  Cardiologist  Direct Dial: [phone]  Fax: [phone]  Website:  [email or web address] 10/29/2022, 9:26 AM

## 2023-02-14 ENCOUNTER — Other Ambulatory Visit: Payer: Self-pay | Admitting: Internal Medicine

## 2023-05-23 LAB — LIPID PANEL
Chol/HDL Ratio: 3.2 {ratio} (ref 0.0–4.4)
Cholesterol, Total: 218 mg/dL — ABNORMAL HIGH (ref 100–199)
HDL: 68 mg/dL (ref >39)
LDL Chol Calc (NIH): 137 mg/dL — ABNORMAL HIGH (ref 0–99)
Triglycerides: 72 mg/dL (ref 0–149)
VLDL Cholesterol Cal: 13 mg/dL (ref 5–40)

## 2023-05-29 ENCOUNTER — Ambulatory Visit (INDEPENDENT_AMBULATORY_CARE_PROVIDER_SITE_OTHER): Payer: Medicare Other | Admitting: Internal Medicine

## 2023-05-29 ENCOUNTER — Telehealth: Payer: Self-pay | Admitting: Licensed Clinical Social Worker

## 2023-05-29 VITALS — BP 118/80 | HR 94 | Ht 64.0 in | Wt 199.0 lb

## 2023-05-29 DIAGNOSIS — T466X5D Adverse effect of antihyperlipidemic and antiarteriosclerotic drugs, subsequent encounter: Secondary | ICD-10-CM | POA: Diagnosis not present

## 2023-05-29 DIAGNOSIS — E785 Hyperlipidemia, unspecified: Secondary | ICD-10-CM

## 2023-05-29 DIAGNOSIS — M791 Myalgia, unspecified site: Secondary | ICD-10-CM | POA: Diagnosis not present

## 2023-05-29 NOTE — Progress Notes (Signed)
 LIPID CLINIC CONSULT NOTE  Chief Complaint:  Follow-up dyslipidemia  Primary Care Physician: Macy Mis, MD  Primary Cardiologist:  None  HPI:  Margaret Hill is a 68 y.o. female who is being seen today for the evaluation of dyslipidemia at the request of Macy Mis, MD.  This is a pleasant 68 year old female with a history of fibromyalgia intolerance, dyslipidemia, hypertension and family history of heart disease including her father who had a stent in his 20s, mother with diabetes, and brother who had heart disease and a pacemaker in his 24s.  She has longstanding elevated cholesterol but had difficulty taking statins because it worsened her fibromyalgia.  More recently she was able to take Livalo 4 mg every other day.  Unfortunately her insurance did not cover it the cost is becoming prohibitive.  She has received samples from her PCP.  Recent labs on therapy in December 2021 showed total cholesterol 178, HDL 71, triglycerides 52 and LDL 97.  This is reasonable treatment however of course given her family history her targets may need to be lower.  We discussed the possibility of further coronary evaluation with calcium scoring.  07/19/2021  Margaret Hill returns today for follow-up of her dyslipidemia.  She had messaged [place] that after switching to [facility], she noted that she started having some worsening fibromyalgia.  Previously she had taken the Livalo 4 mg every other day but had been taking the Zypitamag every day.  She was able to get it through [facility] in [place] and a much lower cost.  I am not sure that this actually caused her worsening fibromyalgia and she is not convinced either.  Her cholesterol however was lower and then when she came off of medication now has recently resulted with LDL back up to 158 from 97.  She agrees she will need additional therapy.  She is interested in restarting the medication but every other day.  03/20/2022  Margaret Hill is seen today in  follow-up.  She reports she has not taking any of statins.  She is actually taking Zetia but every other day which she says is better tolerated than every day which worsens her fibromyalgia pain.  Her cholesterol had gone up quite a bit more, last LDL was 158.  She has not had labs prior to this visit.  10/29/2022  Margaret Hill returns today for follow-up.  She reports has been taking the Zetia every day, however despite this her cholesterol has gone up somewhat.  LDL now 130 with total cholesterol 216.  She has been a little less active and has been trying to modify her diet.  She recently had a fall and injured her left shoulder.  05/29/2023  Margaret Hill is seen today in follow-up.  The plan was to make dietary changes and work on exercise and weight loss to lower cholesterol to try to get a target LDL of 100 or lower.  Unfortunately that did not happen her LDL remains similar to what it was about 7 months ago with total cholesterol 218, triglycerides 72, HDL 68 and LDL 137.  We did discuss other possible options today including adding Nexletol to her ezetimibe.  This combination pill called Nexlizet would likely get her to a target LDL of 100 or lower but she is hesitant to add extra medication at this time.  PMHx:  Past Medical History:  Diagnosis Date   Fibromyalgia    H/O total hysterectomy    Hypercholesteremia  Hypertension     Past Surgical History:  Procedure Laterality Date   BILATERAL TEMPOROMANDIBULAR JOINT ARTHROPLASTY  1990   GALLBLADDER SURGERY  1983   PARTIAL HYSTERECTOMY  1991    FAMHx:  Family History  Problem Relation Age of Onset   Diabetes Mother    Emphysema Father    Breast cancer Paternal Aunt     SOCHx:   reports that she has never smoked. She has never used smokeless tobacco. She reports that she does not currently use alcohol. She reports that she does not use drugs.  ALLERGIES:  Allergies  Allergen Reactions   Aspirin     REACTION: stomach pain    Codeine     REACTION: shortness of breath   Latex    Penicillins     REACTION: hives/skin rash   Pitavastatin Other (See Comments)    Worsening pain   Simvastatin     REACTION: muscular pain   Sulfonamide Derivatives     REACTION: hives/skin rash   Trimethoprim     REACTION: Hives/skin rash    ROS: Pertinent items noted in HPI and remainder of comprehensive ROS otherwise negative.  HOME MEDS: Current Outpatient Medications on File Prior to Visit  Medication Sig Dispense Refill   Albuterol Sulfate 108 (90 Base) MCG/ACT AEPB Inhale 2 puffs into the lungs every 6 (six) hours as needed (for shortness of breath or cough). 1 each 1   buPROPion (WELLBUTRIN XL) 150 MG 24 hr tablet TAKE 1 TABLET(150 MG) BY MOUTH EVERY MORNING 90 tablet 1   ezetimibe (ZETIA) 10 MG tablet TAKE 1 TABLET(10 MG) BY MOUTH DAILY 90 tablet 2   fluocinonide ointment (LIDEX) 0.05 % APPLY EXTERNALLY TO THE AFFECTED AREA TWICE DAILY 30 g 1   losartan-hydrochlorothiazide (HYZAAR) 50-12.5 MG tablet TAKE 1 TABLET BY MOUTH DAILY 30 tablet 0   nitrofurantoin, macrocrystal-monohydrate, (MACROBID) 100 MG capsule Take 1 capsule by mouth at bedtime as needed.     nystatin cream (MYCOSTATIN) Apply 1 application topically 2 (two) times daily. 30 g 0   omeprazole (PRILOSEC OTC) 20 MG tablet Take 20 mg by mouth daily.     valACYclovir (VALTREX) 500 MG tablet Take 500 mg by mouth 2 (two) times daily as needed.     Vitamin D, Ergocalciferol, (DRISDOL) 1.25 MG (50000 UNIT) CAPS capsule TAKE 1 CAPSULE BY MOUTH 2 TIMES A WEEK 24 capsule 1   No current facility-administered medications on file prior to visit.    LABS/IMAGING: No results found for this or any previous visit (from the past 48 hours). No results found.  LIPID PANEL:    Component Value Date/Time   CHOL 218 (H) 05/22/2023 0939   TRIG 72 05/22/2023 0939   HDL 68 05/22/2023 0939   CHOLHDL 3.2 05/22/2023 0939   CHOLHDL 2.2 Ratio 01/22/2008 2254   VLDL 12 01/22/2008  2254   LDLCALC 137 (H) 05/22/2023 0939    WEIGHTS: Wt Readings from Last 3 Encounters:  05/29/23 199 lb (90.3 kg)  10/29/22 197 lb 12.8 oz (89.7 kg)  03/20/22 195 lb (88.5 kg)    VITALS: BP 118/80   Pulse 94   Ht 5\' 4"  (1.626 m)   Wt 199 lb (90.3 kg)   SpO2 97%   BMI 34.16 kg/m   EXAM: Deferred  EKG: Deferred  ASSESSMENT: Mixed dyslipidemia, goal LDL less than 100 Statin intolerant Hypertension Family history of premature coronary disease Fibromyalgia 0 CAC score (03/2021)  PLAN: 1.   Ms. [name] is still  not achieving target LDL less than 100.  She is hesitant to add additional medications.  She wants to continue to work on weight loss and dietary changes.  Will plan repeat labs in about 3 to 4 months and I will contact her with those results.  She might consider switching to Nexlizet from her Zetia for additional benefit.  She will research it.  I will contact her with the lab results and medication adjustments as needed.  Chrystie Nose, MD, [facility], FACP  [place]  [facility]  Medical Director of the Advanced Lipid Disorders &  Cardiovascular Risk Reduction Clinic Diplomate of the American Board of Clinical Lipidology Attending Cardiologist  Direct Dial: [phone]  Fax: [phone]  Website:  [email or web address] 05/29/2023, 8:11 AM

## 2023-05-29 NOTE — Patient Instructions (Signed)
Medication Instructions:  Your physician recommends that you continue on your current medications as directed. Please refer to the Current Medication list given to you today.  *If you need a refill on your cardiac medications before your next appointment, please call your pharmacy*   Lab Work: Your physician recommends that you return for lab work in 4 months: LIPIDS  If you have labs (blood work) drawn today and your tests are completely normal, you will receive your results only by: MyChart Message (if you have MyChart) OR A paper copy in the mail If you have any lab test that is abnormal or we need to change your treatment, we will call you to review the results.   Follow-Up: At [facility], you and your health needs are our priority.  As part of our continuing mission to provide you with exceptional heart care, we have created designated Provider Care Teams.  These Care Teams include your primary Cardiologist (physician) and Advanced Practice Providers (APPs -  Physician Assistants and Nurse Practitioners) who all work together to provide you with the care you need, when you need it.  We recommend signing up for the patient portal called "MyChart".  Sign up information is provided on this After Visit Summary.  MyChart is used to connect with patients for Virtual Visits (Telemedicine).  Patients are able to view lab/test results, encounter notes, upcoming appointments, etc.  Non-urgent messages can be sent to your provider as well.   To learn more about what you can do with MyChart, go to [email or web address].    Your next appointment:   Follow up as needed.  Provider:   Dr. Rennis Golden

## 2023-05-29 NOTE — Telephone Encounter (Signed)
H&V Care Navigation CSW Progress Note  Clinical Social Worker  received a request from pt  to join our Magazine features editor. Sent link to join to pt email she contacted me from- [email or web address]. encouraged her to let us know if any issues accessing [email or web address].   Patient is participating in a Managed Medicaid Plan:  No, Medicare A&B  SDOH Screenings   Food Insecurity: No Food Insecurity (04/30/2023)   Received from [facility]  Housing: Low Risk  (04/30/2023)   Received from [facility]  Transportation Needs: No Transportation Needs (04/30/2023)   Received from [facility]  Utilities: Not At Risk (04/30/2023)   Received from [facility]  Depression (PHQ2-9): Low Risk  (01/24/2022)  Financial Resource Strain: Low Risk  (04/30/2023)   Received from [facility]  Physical Activity: Insufficiently Active (01/19/2023)   Received from [facility]  Social Connections: Moderately Integrated (01/19/2023)   Received from [facility]  Stress: No Stress Concern Present (10/14/2022)   Received from [facility]  Tobacco Use: Low Risk  (04/30/2023)   Received from [facility]    Octavio Graves, MSW, LCSW Clinical Social Worker II [facility] Heart/Vascular Care Navigation  [phone]- work cell phone (preferred) [phone]- desk phone

## 2023-12-19 ENCOUNTER — Other Ambulatory Visit: Payer: Self-pay | Admitting: Medical Genetics

## 2024-01-20 ENCOUNTER — Other Ambulatory Visit

## 2024-01-20 DIAGNOSIS — Z006 Encounter for examination for normal comparison and control in clinical research program: Secondary | ICD-10-CM

## 2024-01-31 LAB — GENECONNECT MOLECULAR SCREEN: Genetic Analysis Overall Interpretation: NEGATIVE
